# Patient Record
Sex: Male | Born: 1960 | Race: White | Hispanic: No | Smoking: Former smoker
Health system: Southern US, Community
[De-identification: ages and names within clinical notes are randomized; demographics above are authoritative.]

## PROBLEM LIST (undated history)

## (undated) DIAGNOSIS — I2510 Atherosclerotic heart disease of native coronary artery without angina pectoris: Secondary | ICD-10-CM

## (undated) DIAGNOSIS — N200 Calculus of kidney: Secondary | ICD-10-CM

## (undated) DIAGNOSIS — I10 Essential (primary) hypertension: Secondary | ICD-10-CM

---

## 2004-10-01 ENCOUNTER — Emergency Department: Payer: Self-pay | Admitting: Emergency Medicine

## 2008-03-01 ENCOUNTER — Ambulatory Visit: Payer: Self-pay | Admitting: Family Medicine

## 2008-06-07 ENCOUNTER — Ambulatory Visit: Payer: Self-pay | Admitting: Family Medicine

## 2008-10-19 ENCOUNTER — Ambulatory Visit: Payer: Self-pay | Admitting: Internal Medicine

## 2010-02-16 ENCOUNTER — Ambulatory Visit: Payer: Self-pay | Admitting: Internal Medicine

## 2010-02-25 ENCOUNTER — Ambulatory Visit: Payer: Self-pay | Admitting: Family Medicine

## 2010-12-24 ENCOUNTER — Ambulatory Visit: Payer: Self-pay | Admitting: Unknown Physician Specialty

## 2012-03-18 ENCOUNTER — Emergency Department: Payer: Self-pay | Admitting: Emergency Medicine

## 2012-03-26 ENCOUNTER — Ambulatory Visit: Payer: Self-pay | Admitting: Otolaryngology

## 2013-04-09 ENCOUNTER — Ambulatory Visit: Payer: Self-pay

## 2014-06-12 ENCOUNTER — Ambulatory Visit: Payer: Self-pay | Admitting: Urology

## 2014-07-03 ENCOUNTER — Ambulatory Visit: Payer: Self-pay | Admitting: Urology

## 2014-07-06 ENCOUNTER — Emergency Department: Payer: Self-pay | Admitting: Emergency Medicine

## 2014-07-06 LAB — URINALYSIS, COMPLETE
Bacteria: NONE SEEN
Bilirubin,UR: NEGATIVE
Glucose,UR: NEGATIVE mg/dL (ref 0–75)
Ketone: NEGATIVE
Leukocyte Esterase: NEGATIVE
NITRITE: NEGATIVE
PH: 6 (ref 4.5–8.0)
PROTEIN: NEGATIVE
RBC,UR: 9 /HPF (ref 0–5)
SPECIFIC GRAVITY: 1.009 (ref 1.003–1.030)
Squamous Epithelial: <1
WBC UR: 1 /HPF (ref 0–5)

## 2014-07-06 LAB — CBC
HCT: 47.3 % (ref 40.0–52.0)
HGB: 15.7 g/dL (ref 13.0–18.0)
MCH: 28.9 pg (ref 26.0–34.0)
MCHC: 33.2 g/dL (ref 32.0–36.0)
MCV: 87 fL (ref 80–100)
Platelet: 193 10*3/uL (ref 150–440)
RBC: 5.42 10*6/uL (ref 4.40–5.90)
RDW: 13.5 % (ref 11.5–14.5)
WBC: 6.6 10*3/uL (ref 3.8–10.6)

## 2014-07-06 LAB — BASIC METABOLIC PANEL
Anion Gap: 11 (ref 7–16)
BUN: 21 mg/dL — ABNORMAL HIGH (ref 7–18)
CALCIUM: 8.9 mg/dL (ref 8.5–10.1)
Chloride: 105 mmol/L (ref 98–107)
Co2: 22 mmol/L (ref 21–32)
Creatinine: 1.32 mg/dL — ABNORMAL HIGH (ref 0.60–1.30)
Glucose: 112 mg/dL — ABNORMAL HIGH (ref 65–99)
Osmolality: 279 (ref 275–301)
Potassium: 3.9 mmol/L (ref 3.5–5.1)
SODIUM: 138 mmol/L (ref 136–145)

## 2014-07-31 ENCOUNTER — Ambulatory Visit: Payer: Self-pay | Admitting: Urology

## 2019-03-21 ENCOUNTER — Encounter
Admission: EM | Disposition: A | Discharge status: Home / Self Care | Payer: Self-pay | Source: Home / Self Care | Attending: Internal Medicine

## 2019-03-21 ENCOUNTER — Other Ambulatory Visit: Payer: Self-pay

## 2019-03-21 ENCOUNTER — Inpatient Hospital Stay
Admission: EM | Admit: 2019-03-21 | Discharge: 2019-03-23 | DRG: 246 | Disposition: A | Discharge status: Home / Self Care | Payer: BC Managed Care – PPO | Attending: Internal Medicine | Admitting: Internal Medicine

## 2019-03-21 ENCOUNTER — Inpatient Hospital Stay
Admit: 2019-03-21 | Discharge: 2019-03-21 | Disposition: A | Discharge status: Home / Self Care | Payer: BC Managed Care – PPO | Attending: Cardiology | Admitting: Cardiology

## 2019-03-21 ENCOUNTER — Emergency Department: Payer: BC Managed Care – PPO

## 2019-03-21 ENCOUNTER — Encounter: Payer: Self-pay | Admitting: Emergency Medicine

## 2019-03-21 ENCOUNTER — Inpatient Hospital Stay: Admit: 2019-03-21 | Payer: BC Managed Care – PPO

## 2019-03-21 DIAGNOSIS — I469 Cardiac arrest, cause unspecified: Secondary | ICD-10-CM | POA: Diagnosis present

## 2019-03-21 DIAGNOSIS — R079 Chest pain, unspecified: Secondary | ICD-10-CM | POA: Diagnosis present

## 2019-03-21 DIAGNOSIS — Z20828 Contact with and (suspected) exposure to other viral communicable diseases: Secondary | ICD-10-CM | POA: Diagnosis present

## 2019-03-21 DIAGNOSIS — Z87442 Personal history of urinary calculi: Secondary | ICD-10-CM

## 2019-03-21 DIAGNOSIS — R7301 Impaired fasting glucose: Secondary | ICD-10-CM | POA: Diagnosis present

## 2019-03-21 DIAGNOSIS — E785 Hyperlipidemia, unspecified: Secondary | ICD-10-CM | POA: Diagnosis present

## 2019-03-21 DIAGNOSIS — Z6841 Body Mass Index (BMI) 40.0 and over, adult: Secondary | ICD-10-CM

## 2019-03-21 DIAGNOSIS — I4901 Ventricular fibrillation: Secondary | ICD-10-CM | POA: Diagnosis present

## 2019-03-21 DIAGNOSIS — I480 Paroxysmal atrial fibrillation: Secondary | ICD-10-CM | POA: Diagnosis present

## 2019-03-21 DIAGNOSIS — I2102 ST elevation (STEMI) myocardial infarction involving left anterior descending coronary artery: Secondary | ICD-10-CM | POA: Diagnosis present

## 2019-03-21 DIAGNOSIS — I2510 Atherosclerotic heart disease of native coronary artery without angina pectoris: Secondary | ICD-10-CM | POA: Diagnosis present

## 2019-03-21 DIAGNOSIS — E669 Obesity, unspecified: Secondary | ICD-10-CM | POA: Diagnosis present

## 2019-03-21 DIAGNOSIS — Z955 Presence of coronary angioplasty implant and graft: Secondary | ICD-10-CM | POA: Diagnosis not present

## 2019-03-21 DIAGNOSIS — E876 Hypokalemia: Secondary | ICD-10-CM | POA: Diagnosis present

## 2019-03-21 DIAGNOSIS — I213 ST elevation (STEMI) myocardial infarction of unspecified site: Secondary | ICD-10-CM | POA: Diagnosis present

## 2019-03-21 HISTORY — DX: Calculus of kidney: N20.0

## 2019-03-21 HISTORY — PX: LEFT HEART CATH AND CORONARY ANGIOGRAPHY: CATH118249

## 2019-03-21 HISTORY — PX: CORONARY/GRAFT ACUTE MI REVASCULARIZATION: CATH118305

## 2019-03-21 LAB — TROPONIN I (HIGH SENSITIVITY)
Troponin I (High Sensitivity): 17 ng/L (ref <18)
Troponin I (High Sensitivity): 673 ng/L (ref <18)

## 2019-03-21 LAB — BASIC METABOLIC PANEL
Anion gap: 17 — ABNORMAL HIGH (ref 5–15)
BUN: 16 mg/dL (ref 6–20)
CO2: 22 mmol/L (ref 22–32)
Calcium: 9.4 mg/dL (ref 8.9–10.3)
Chloride: 102 mmol/L (ref 98–111)
Creatinine, Ser: 1.1 mg/dL (ref 0.61–1.24)
GFR calc Af Amer: >60 mL/min (ref >60)
GFR calc non Af Amer: >60 mL/min (ref >60)
Glucose, Bld: 188 mg/dL — ABNORMAL HIGH (ref 70–99)
Potassium: 3.4 mmol/L — ABNORMAL LOW (ref 3.5–5.1)
Sodium: 141 mmol/L (ref 135–145)

## 2019-03-21 LAB — PROTIME-INR
INR: 1 (ref 0.8–1.2)
Prothrombin Time: 13.1 seconds (ref 11.4–15.2)

## 2019-03-21 LAB — GLUCOSE, CAPILLARY: Glucose-Capillary: 149 mg/dL — ABNORMAL HIGH (ref 70–99)

## 2019-03-21 LAB — MRSA PCR SCREENING: MRSA by PCR: POSITIVE — AB

## 2019-03-21 LAB — LIPID PANEL
Cholesterol: 206 mg/dL — ABNORMAL HIGH (ref 0–200)
HDL: 37 mg/dL — ABNORMAL LOW (ref >40)
LDL Cholesterol: 145 mg/dL — ABNORMAL HIGH (ref 0–99)
Total CHOL/HDL Ratio: 5.6 RATIO
Triglycerides: 119 mg/dL (ref <150)
VLDL: 24 mg/dL (ref 0–40)

## 2019-03-21 LAB — POCT ACTIVATED CLOTTING TIME
Activated Clotting Time: 235 seconds
Activated Clotting Time: 246 seconds
Activated Clotting Time: 241 seconds

## 2019-03-21 LAB — CBC
HCT: 50.3 % (ref 39.0–52.0)
Hemoglobin: 16.9 g/dL (ref 13.0–17.0)
MCH: 28.9 pg (ref 26.0–34.0)
MCHC: 33.6 g/dL (ref 30.0–36.0)
MCV: 86.1 fL (ref 80.0–100.0)
Platelets: 232 10*3/uL (ref 150–400)
RBC: 5.84 MIL/uL — ABNORMAL HIGH (ref 4.22–5.81)
RDW: 13.5 % (ref 11.5–15.5)
WBC: 10.7 10*3/uL — ABNORMAL HIGH (ref 4.0–10.5)
nRBC: 0 % (ref 0.0–0.2)

## 2019-03-21 LAB — SARS CORONAVIRUS 2 BY RT PCR (HOSPITAL ORDER, PERFORMED IN ~~LOC~~ HOSPITAL LAB): SARS Coronavirus 2: NEGATIVE

## 2019-03-21 LAB — ECHOCARDIOGRAM COMPLETE
Height: 74 in
Weight: 5030.02 oz

## 2019-03-21 LAB — APTT: aPTT: 27 seconds (ref 24–36)

## 2019-03-21 LAB — SARS CORONAVIRUS 2 (TAT 6-24 HRS): SARS Coronavirus 2: NEGATIVE

## 2019-03-21 SURGERY — CORONARY/GRAFT ACUTE MI REVASCULARIZATION
Anesthesia: Moderate Sedation

## 2019-03-21 MED ORDER — LABETALOL HCL 5 MG/ML IV SOLN
10.0000 mg | INTRAVENOUS | Status: AC | PRN
  Administered 2019-03-21: 10 mg via INTRAVENOUS
  Filled 2019-03-21: qty 4

## 2019-03-21 MED ORDER — ENOXAPARIN SODIUM 40 MG/0.4ML ~~LOC~~ SOLN
40.0000 mg | Freq: Two times a day (BID) | SUBCUTANEOUS | Status: DC
  Administered 2019-03-21 – 2019-03-23 (×4): 40 mg via SUBCUTANEOUS
  Filled 2019-03-21 (×4): qty 0.4

## 2019-03-21 MED ORDER — TICAGRELOR 90 MG PO TABS
ORAL_TABLET | ORAL | Status: DC | PRN
  Administered 2019-03-21: 180 mg via ORAL

## 2019-03-21 MED ORDER — SODIUM CHLORIDE 0.9% FLUSH
3.0000 mL | Freq: Two times a day (BID) | INTRAVENOUS | Status: DC
  Administered 2019-03-21 – 2019-03-23 (×4): 3 mL via INTRAVENOUS

## 2019-03-21 MED ORDER — HEPARIN SODIUM (PORCINE) 1000 UNIT/ML IJ SOLN
INTRAMUSCULAR | Status: AC
  Filled 2019-03-21: qty 1

## 2019-03-21 MED ORDER — NITROGLYCERIN 1 MG/10 ML FOR IR/CATH LAB
INTRA_ARTERIAL | Status: AC
  Filled 2019-03-21: qty 10

## 2019-03-21 MED ORDER — HEPARIN (PORCINE) IN NACL 1000-0.9 UT/500ML-% IV SOLN
INTRAVENOUS | Status: DC | PRN
  Administered 2019-03-21: 1000 mL

## 2019-03-21 MED ORDER — POTASSIUM CHLORIDE CRYS ER 20 MEQ PO TBCR
40.0000 meq | EXTENDED_RELEASE_TABLET | Freq: Once | ORAL | Status: AC
  Administered 2019-03-21: 40 meq via ORAL
  Filled 2019-03-21: qty 2

## 2019-03-21 MED ORDER — SODIUM CHLORIDE 0.9% FLUSH: 3.0000 mL | INTRAVENOUS | Status: DC | PRN

## 2019-03-21 MED ORDER — SODIUM CHLORIDE 0.9 % IV SOLN: 250.0000 mL | INTRAVENOUS | Status: DC | PRN

## 2019-03-21 MED ORDER — METOPROLOL TARTRATE 5 MG/5ML IV SOLN
INTRAVENOUS | Status: DC | PRN
  Administered 2019-03-21 (×2): 2.5 mg via INTRAVENOUS

## 2019-03-21 MED ORDER — VERAPAMIL HCL 2.5 MG/ML IV SOLN
INTRAVENOUS | Status: AC
  Filled 2019-03-21: qty 2

## 2019-03-21 MED ORDER — TICAGRELOR 90 MG PO TABS
ORAL_TABLET | ORAL | Status: AC
  Filled 2019-03-21: qty 2

## 2019-03-21 MED ORDER — HEPARIN SODIUM (PORCINE) 5000 UNIT/ML IJ SOLN: 4000.0000 [IU] | Freq: Once | INTRAMUSCULAR | Status: DC

## 2019-03-21 MED ORDER — CHLORHEXIDINE GLUCONATE CLOTH 2 % EX PADS: 6.0000 | MEDICATED_PAD | Freq: Every day | CUTANEOUS | Status: DC

## 2019-03-21 MED ORDER — VERAPAMIL HCL 2.5 MG/ML IV SOLN
INTRAVENOUS | Status: DC | PRN
  Administered 2019-03-21: 2.5 mg via INTRAVENOUS

## 2019-03-21 MED ORDER — HYDRALAZINE HCL 20 MG/ML IJ SOLN: 10.0000 mg | INTRAMUSCULAR | Status: AC | PRN

## 2019-03-21 MED ORDER — MUPIROCIN 2 % EX OINT
1.0000 "application " | TOPICAL_OINTMENT | Freq: Two times a day (BID) | CUTANEOUS | Status: DC
  Administered 2019-03-21 – 2019-03-23 (×5): 1 via NASAL
  Filled 2019-03-21 (×3): qty 22

## 2019-03-21 MED ORDER — SODIUM CHLORIDE 0.9% FLUSH
3.0000 mL | Freq: Once | INTRAVENOUS | Status: AC
  Administered 2019-03-23: 3 mL via INTRAVENOUS

## 2019-03-21 MED ORDER — INFLUENZA VAC SPLIT QUAD 0.5 ML IM SUSY: 0.5000 mL | PREFILLED_SYRINGE | INTRAMUSCULAR | Status: DC

## 2019-03-21 MED ORDER — NITROGLYCERIN 1 MG/10 ML FOR IR/CATH LAB
INTRA_ARTERIAL | Status: DC | PRN
  Administered 2019-03-21: 200 ug via INTRACORONARY

## 2019-03-21 MED ORDER — SODIUM CHLORIDE 0.9 % WEIGHT BASED INFUSION
1.0000 mL/kg/h | INTRAVENOUS | Status: AC
  Administered 2019-03-21 (×2): 1 mL/kg/h via INTRAVENOUS

## 2019-03-21 MED ORDER — TICAGRELOR 90 MG PO TABS
90.0000 mg | ORAL_TABLET | Freq: Two times a day (BID) | ORAL | Status: DC
  Administered 2019-03-21 – 2019-03-23 (×4): 90 mg via ORAL
  Filled 2019-03-21 (×4): qty 1

## 2019-03-21 MED ORDER — ATORVASTATIN CALCIUM 80 MG PO TABS
80.0000 mg | ORAL_TABLET | Freq: Every day | ORAL | Status: DC
  Administered 2019-03-21 – 2019-03-22 (×2): 80 mg via ORAL
  Filled 2019-03-21: qty 1
  Filled 2019-03-21 (×2): qty 4
  Filled 2019-03-21: qty 1

## 2019-03-21 MED ORDER — ACETAMINOPHEN 325 MG PO TABS: 650.0000 mg | ORAL_TABLET | ORAL | Status: DC | PRN

## 2019-03-21 MED ORDER — CHLORHEXIDINE GLUCONATE CLOTH 2 % EX PADS
6.0000 | MEDICATED_PAD | Freq: Every day | CUTANEOUS | Status: DC
  Administered 2019-03-21 – 2019-03-22 (×2): 6 via TOPICAL

## 2019-03-21 MED ORDER — METOPROLOL SUCCINATE ER 50 MG PO TB24
50.0000 mg | ORAL_TABLET | Freq: Every day | ORAL | Status: DC
  Administered 2019-03-21 – 2019-03-23 (×3): 50 mg via ORAL
  Filled 2019-03-21 (×3): qty 1

## 2019-03-21 MED ORDER — NITROGLYCERIN 0.4 MG SL SUBL: 0.4000 mg | SUBLINGUAL_TABLET | SUBLINGUAL | Status: DC | PRN

## 2019-03-21 MED ORDER — ONDANSETRON HCL 4 MG/2ML IJ SOLN: 4.0000 mg | Freq: Four times a day (QID) | INTRAMUSCULAR | Status: DC | PRN

## 2019-03-21 MED ORDER — IOHEXOL 300 MG/ML  SOLN
INTRAMUSCULAR | Status: DC | PRN
  Administered 2019-03-21: 215 mL

## 2019-03-21 MED ORDER — ASPIRIN 81 MG PO CHEW
324.0000 mg | CHEWABLE_TABLET | Freq: Once | ORAL | Status: DC
  Filled 2019-03-21: qty 4

## 2019-03-21 MED ORDER — METOPROLOL TARTRATE 5 MG/5ML IV SOLN
INTRAVENOUS | Status: AC
  Filled 2019-03-21: qty 5

## 2019-03-21 MED ORDER — LISINOPRIL 5 MG PO TABS
2.5000 mg | ORAL_TABLET | Freq: Every day | ORAL | Status: DC
  Administered 2019-03-21 – 2019-03-23 (×3): 2.5 mg via ORAL
  Filled 2019-03-21 (×3): qty 1

## 2019-03-21 MED ORDER — HEPARIN SODIUM (PORCINE) 1000 UNIT/ML IJ SOLN
INTRAMUSCULAR | Status: DC | PRN
  Administered 2019-03-21 (×2): 4000 [IU] via INTRAVENOUS
  Administered 2019-03-21: 8000 [IU] via INTRAVENOUS
  Administered 2019-03-21: 3000 [IU] via INTRAVENOUS

## 2019-03-21 MED ORDER — ASPIRIN 81 MG PO CHEW
81.0000 mg | CHEWABLE_TABLET | Freq: Every day | ORAL | Status: DC
  Administered 2019-03-21 – 2019-03-23 (×3): 81 mg via ORAL
  Filled 2019-03-21 (×3): qty 1

## 2019-03-21 MED ORDER — AMIODARONE HCL IN DEXTROSE 360-4.14 MG/200ML-% IV SOLN
30.0000 mg/h | INTRAVENOUS | Status: DC
  Administered 2019-03-21 – 2019-03-23 (×3): 30 mg/h via INTRAVENOUS
  Filled 2019-03-21 (×5): qty 200

## 2019-03-21 MED ORDER — TRAZODONE HCL 50 MG PO TABS
50.0000 mg | ORAL_TABLET | Freq: Every evening | ORAL | Status: DC | PRN
  Administered 2019-03-21: 22:00:00 50 mg via ORAL
  Filled 2019-03-21: qty 1

## 2019-03-21 MED ORDER — AMIODARONE HCL IN DEXTROSE 360-4.14 MG/200ML-% IV SOLN
60.0000 mg/h | INTRAVENOUS | Status: AC
  Administered 2019-03-21 (×2): 60 mg/h via INTRAVENOUS

## 2019-03-21 SURGICAL SUPPLY — 16 items
BALLN TREK RX 2.5X12 (BALLOONS) ×3
BALLN ~~LOC~~ TREK RX 3.25X12 (BALLOONS) ×3
BALLOON TREK RX 2.5X12 (BALLOONS) ×1 IMPLANT
BALLOON ~~LOC~~ TREK RX 3.25X12 (BALLOONS) ×1 IMPLANT
CATH INFINITI 5FR ANG PIGTAIL (CATHETERS) ×3 IMPLANT
CATH INFINITI JR4 5F (CATHETERS) ×3 IMPLANT
CATH VISTA GUIDE 6FR XB3.5 (CATHETERS) ×3 IMPLANT
DEVICE INFLAT 30 PLUS (MISCELLANEOUS) ×3 IMPLANT
DEVICE RAD COMP TR BAND LRG (VASCULAR PRODUCTS) ×3 IMPLANT
DEVICE RAD TR BAND REGULAR (VASCULAR PRODUCTS) ×3 IMPLANT
GLIDESHEATH SLEND SS 6F .021 (SHEATH) ×3 IMPLANT
KIT MANI 3VAL PERCEP (MISCELLANEOUS) ×3 IMPLANT
PACK CARDIAC CATH (CUSTOM PROCEDURE TRAY) ×3 IMPLANT
STENT RESOLUTE ONYX 3.0X18 (Permanent Stent) ×3 IMPLANT
WIRE ASAHI PROWATER 180CM (WIRE) ×3 IMPLANT
WIRE ROSEN-J .035X260CM (WIRE) ×3 IMPLANT

## 2019-03-21 NOTE — Consult Note (Signed)
[facility] Cardiology  CARDIOLOGY CONSULT NOTE  Patient ID: Atticus Wedin MRN: [id number] DOB/AGE: [birth year] 59 y.o.  Admit date: 03/21/2019 Referring Physician [facility] Primary Physician  Primary Cardiologist Reason for Consultation anterolateral ST elevation myocardial infarction  HPI: 58 year old gentleman referred for evaluation and new onset chest pain and ECG revealing anterolateral ST elevation myocardial infarction.  The patient was in his use of health until 7 AM when he awoke with chest pain with radiation to his right arm.  Patient arrived at [facility] ED where he appeared diaphoretic with nausea with 6 out of 10 chest pain.  ECG revealed atrial fibrillation, with rapid ventricular rate, at 147 bpm, with ST elevations in leads I, aVL,V2 and V3.  In the emergency room the patient experienced ventricular fibrillation requiring defibrillation with brief chest compressions and bag valve mask ventilations.  The patient received 300 mg bolus of IV amiodarone.  Review of systems complete and found to be negative unless listed above     Past Medical History:  Diagnosis Date  . Kidney stone     Past Surgical History:  Procedure Laterality Date  . CORONARY/GRAFT ACUTE MI REVASCULARIZATION N/A 03/21/2019   Procedure: Coronary/Graft Acute MI Revascularization;  Surgeon: Marcina Millard, MD;  Location: [facility] INVASIVE CV LAB;  Service: Cardiovascular;  Laterality: N/A;  . LEFT HEART CATH AND CORONARY ANGIOGRAPHY N/A 03/21/2019   Procedure: LEFT HEART CATH AND CORONARY ANGIOGRAPHY;  Surgeon: Marcina Millard, MD;  Location: [facility] INVASIVE CV LAB;  Service: Cardiovascular;  Laterality: N/A;    No medications prior to admission.   Social History   Socioeconomic History  . Marital status: Divorced    Spouse name: Not on file  . Number of children: Not on file  . Years of education: Not on file  . Highest education level: Not on file  Occupational History  . Not on file  Social Needs   . Financial resource strain: Not on file  . Food insecurity    Worry: Not on file    Inability: Not on file  . Transportation needs    Medical: Not on file    Non-medical: Not on file  Tobacco Use  . Smoking status: Never Smoker  . Smokeless tobacco: Never Used  Substance and Sexual Activity  . Alcohol use: Not Currently  . Drug use: Not on file  . Sexual activity: Not on file  Lifestyle  . Physical activity    Days per week: Not on file    Minutes per session: Not on file  . Stress: Not on file  Relationships  . Social Musician on phone: Not on file    Gets together: Not on file    Attends religious service: Not on file    Active member of club or organization: Not on file    Attends meetings of clubs or organizations: Not on file    Relationship status: Not on file  . Intimate partner violence    Fear of current or ex partner: Not on file    Emotionally abused: Not on file    Physically abused: Not on file    Forced sexual activity: Not on file  Other Topics Concern  . Not on file  Social History Narrative  . Not on file    No family history on file.    Review of systems complete and found to be negative unless listed above      PHYSICAL EXAM  General: Well developed, well nourished, in no  acute distress HEENT:  Normocephalic and atramatic Neck:  No JVD.  Lungs: Clear bilaterally to auscultation and percussion. Heart: HRRR . Normal S1 and S2 without gallops or murmurs.  Abdomen: Bowel sounds are positive, abdomen soft and non-tender  Msk:  Back normal, normal gait. Normal strength and tone for age. Extremities: No clubbing, cyanosis or edema.   Neuro: Alert and oriented X 3. Psych:  Good affect, responds appropriately  Labs:   Lab Results  Component Value Date   WBC 10.7 (H) 03/21/2019   HGB 16.9 03/21/2019   HCT 50.3 03/21/2019   MCV 86.1 03/21/2019   PLT 232 03/21/2019    Recent Labs  Lab 03/21/19 0927  NA 141  K 3.4*  CL 102   CO2 22  BUN 16  CREATININE 1.10  CALCIUM 9.4  GLUCOSE 188*   No results found for: CKTOTAL, CKMB, CKMBINDEX, TROPONINI  Lab Results  Component Value Date   CHOL 206 (H) 03/21/2019   Lab Results  Component Value Date   HDL 37 (L) 03/21/2019   Lab Results  Component Value Date   LDLCALC 145 (H) 03/21/2019   Lab Results  Component Value Date   TRIG 119 03/21/2019   Lab Results  Component Value Date   CHOLHDL 5.6 03/21/2019   No results found for: LDLDIRECT    Radiology: Dg Chest Port 1 View  Result Date: 03/21/2019 CLINICAL DATA:  Chest pain. EXAM: PORTABLE CHEST 1 VIEW COMPARISON:  March 18, 2012 FINDINGS: External pacing pad overlies the left lower hemithorax. Cardiomediastinal silhouette is normal. Mediastinal contours appear intact. 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 There is no evidence of focal airspace consolidation, pleural effusion or pneumothorax. Osseous structures are without acute abnormality. Soft tissues are grossly normal. IMPRESSION: 1. No active cardiopulmonary disease. 2. 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 Electronically Signed   By: Fidela Salisbury M.D.   On: 03/21/2019 09:45    EKG: atrialL fibrillation with rapid ventricular rate, ST elevation leads I, aVL, V2, V3  ASSESSMENT AND PLAN:   1.  Acute anterolateral ST elevation myocardial infarction 2.  Status post V. fib arrest requiring cardioversion and amiodarone bolus, now stable, in sinus rhythm, 3.  Atrial fibrillation with rapid ventricular rate  Recommendations  1.  Amiodarone infusion 2.  Proceed with emergent cardiac catheterization and probable primary PCI  Signed: Isaias Cowman MD,PhD, [facility] 03/21/2019, 11:13 AM

## 2019-03-21 NOTE — ED Notes (Cosign Needed Addendum)
Bill, RN talking to patient when he went unresponsive. Bil called Dr. Jimmye Norman to bedside. Compressions started.

## 2019-03-21 NOTE — ED Provider Notes (Signed)
[facility] Emergency Department Provider Note       Time seen: ----------------------------------------- 9:33 AM on 03/21/2019 -----------------------------------------   I have reviewed the triage vital signs and the nursing notes.  HISTORY   Chief Complaint Chest Pain   HPI Justin Lin is a 58 y.o. male with a history of kidney stones who presents to the ED for chest pain since 7 AM but goes into his right arm and comes in waves of worst pain.  Patient arrived slightly diaphoretic and now has nausea.  Discomfort is 6 out of 10, he has never had this before.  Past Medical History:  Diagnosis Date  . Kidney stone     There are no active problems to display for this patient.   History reviewed. No pertinent surgical history.  Allergies Patient has no known allergies.  Social History Social History   Tobacco Use  . Smoking status: Never Smoker  . Smokeless tobacco: Never Used  Substance Use Topics  . Alcohol use: Not Currently  . Drug use: Not on file   Review of Systems Constitutional: Negative for fever. Cardiovascular: Positive for chest pain Respiratory: Negative for shortness of breath. Gastrointestinal: Negative for abdominal pain, vomiting and diarrhea. Musculoskeletal: Negative for back pain. Skin: Positive for diaphoresis Neurological: Negative for headaches, focal weakness or numbness.  All systems negative/normal/unremarkable except as stated in the HPI  ____________________________________________   PHYSICAL EXAM:  VITAL SIGNS: ED Triage Vitals  Enc Vitals Group     BP 03/21/19 0912 (!) 150/96     Pulse Rate 03/21/19 0912 83     Resp 03/21/19 0912 16     Temp 03/21/19 0912 98.3 F (36.8 C)     Temp Source 03/21/19 0912 Oral     SpO2 03/21/19 0912 98 %     Weight 03/21/19 0906 (!) 320 lb (145.2 kg)     Height 03/21/19 0906 6\' 2"  (1.88 m)     Head Circumference --      Peak Flow --      Pain Score 03/21/19  0906 6     Pain Loc --      Pain Edu? --      Excl. in GC? --    Constitutional: Alert and oriented. Well appearing and in no distress. Eyes: Conjunctivae are normal. Normal extraocular movements. ENT      Head: Normocephalic and atraumatic.      Nose: No congestion/rhinnorhea.      Mouth/Throat: Mucous membranes are moist.      Neck: No stridor. Cardiovascular: Normal rate, regular rhythm. No murmurs, rubs, or gallops. Respiratory: Normal respiratory effort without tachypnea nor retractions. Breath sounds are clear and equal bilaterally. No wheezes/rales/rhonchi. Gastrointestinal: Soft and nontender. Normal bowel sounds Musculoskeletal: Nontender with normal range of motion in extremities. No lower extremity tenderness nor edema. Neurologic:  Normal speech and language. No gross focal neurologic deficits are appreciated.  Skin:  Skin is warm, with slight diaphoresis noted Psychiatric: Mood and affect are normal. Speech and behavior are normal.  ____________________________________________  EKG: Interpreted by me.  Sinus rhythm with rate of 85 bpm, ST depressions are noted inferiorly, normal axis, normal QT  ____________________________________________  ED COURSE:  As part of my medical decision making, I reviewed the following data within the electronic MEDICAL RECORD NUMBER History obtained from family if available, nursing notes, old chart and ekg, as well as notes from prior ED visits. Patient presented for chest pain, we will assess with labs  and imaging as indicated at this time.   Procedures  Justin Lin was evaluated in Emergency Department on 03/21/2019 for the symptoms described in the history of present illness. He was evaluated in the context of the global COVID-19 pandemic, which necessitated consideration that the patient might be at risk for infection with the SARS-CoV-2 virus that causes COVID-19. Institutional protocols and algorithms that pertain to the evaluation of  patients at risk for COVID-19 are in a state of rapid change based on information released by regulatory bodies including the CDC and federal and state organizations. These policies and algorithms were followed during the patient's care in the ED.  ____________________________________________   CRITICAL CARE Performed by: Laurence Aly   Total critical care time: 30 minutes  Critical care time was exclusive of separately billable procedures and treating other patients.  Critical care was necessary to treat or prevent imminent or life-threatening deterioration.  Critical care was time spent personally by me on the following activities: development of treatment plan with patient and/or surrogate as well as nursing, discussions with consultants, evaluation of patient's response to treatment, examination of patient, obtaining history from patient or surrogate, ordering and performing treatments and interventions, ordering and review of laboratory studies, ordering and review of radiographic studies, pulse oximetry and re-evaluation of patient's condition.  LABS (pertinent positives/negatives)  Labs Reviewed  SARS CORONAVIRUS 2 (TAT 6-24 HRS)  BASIC METABOLIC PANEL  CBC  PROTIME-INR  APTT  LIPID PANEL  TROPONIN I (HIGH SENSITIVITY)    RADIOLOGY Images were viewed by me  Chest x-ray  ____________________________________________   DIFFERENTIAL DIAGNOSIS   Unstable angina, MI, PE, dissection  FINAL ASSESSMENT AND PLAN  Chest pain, STEMI   Plan: The patient had presented for chest pain. Patient's labs are still pending at this time.  Patient initially had an EKG which revealed some ST depressions but overall no signs of ST elevation.  He subsequently went into cardiac arrest and appeared to be in V. fib.  He was defibrillated once while chest compressions and bag-valve-mask ventilations were being performed.  He had return of spontaneous circulation.  Patient woke up and is  still complaining of chest pressure.  EKG reveals ST elevation in rapid A. fib.  During this process he was given 300 mg of IV amiodarone and was placed on amiodarone drip.  We also have ordered heparin bolus and given aspirin.  I have activated a code STEMI and he will be taken to the Cath Lab for emergent stenting.   Laurence Aly, MD    Note: This note was generated in part or whole with voice recognition software. Voice recognition is usually quite accurate but there are transcription errors that can and very often do occur. I apologize for any typographical errors that were not detected and corrected.     Earleen Newport, MD 03/21/19 [phone]

## 2019-03-21 NOTE — ED Notes (Signed)
Dr. Jimmye Norman at bedside with other staff members. Vfib on monitor, shock given. Compressions resumed.

## 2019-03-21 NOTE — H&P (Signed)
[place] [facility] [place] NAME: Justin Lin    MR#:  [id number]  DATE OF BIRTH:  Apr 28, 1961  DATE OF ADMISSION:  03/21/2019  PRIMARY CARE PHYSICIAN: No primary care provider on file.   REQUESTING/REFERRING PHYSICIAN: Dr. Saralyn Pilar  CHIEF COMPLAINT:   Chief Complaint  Patient presents with  . Chest Pain    HISTORY OF PRESENT ILLNESS:  Justin Lin  is a 58 y.o. male with a known history of kidney stones who presented to the emergency room with complaints of chest pain.  Chest pain started about 7 AM this morning when patient woke up.  Located on the right side of the chest and radiating to the right arm.  Patient arrived in the emergency room and was reported to have been diaphoretic.  With some nausea.  Twelve-lead EKG revealed atrial fibrillation with rapid ventricular response with rate in the 140s with evidence of ST elevations in lead I, aVL, V2 and V3.  Patient reported to have experienced ventricular fibrillation requiring defibrillation with brief chest compression and bag valve ventilation in the emergency room.  Patient received a bolus of 200 mg of IV amiodarone.  Was immediately taken to the2.  One-vessel coronary artery disease with thrombotic 99% stenosis proximal/mid LAD. Mild use left ventricular function with anterior apical hypokinesis. Successful primary PCI with DES proximal/mid LADubsequently transferred to the ICU.  Medical service called to admit patient.  PAST MEDICAL HISTORY:   Past Medical History:  Diagnosis Date  . Kidney stone     PAST SURGICAL HISTORY:   Past Surgical History:  Procedure Laterality Date  . CORONARY/GRAFT ACUTE MI REVASCULARIZATION N/A 03/21/2019   Procedure: Coronary/Graft Acute MI Revascularization;  Surgeon: Isaias Cowman, MD;  Location: [place] CV LAB;  Service: Cardiovascular;  Laterality: N/A;  . LEFT HEART CATH AND CORONARY ANGIOGRAPHY N/A 03/21/2019   Procedure: LEFT HEART  CATH AND CORONARY ANGIOGRAPHY;  Surgeon: Isaias Cowman, MD;  Location: [place] CV LAB;  Service: Cardiovascular;  Laterality: N/A;    SOCIAL HISTORY:   Social History   Tobacco Use  . Smoking status: Never Smoker  . Smokeless tobacco: Never Used  Substance Use Topics  . Alcohol use: Not Currently    FAMILY HISTORY:  No family history on file.  DRUG ALLERGIES:  No Known Allergies  REVIEW OF SYSTEMS:   Review of Systems  Constitutional: Negative for chills and fever.  HENT: Negative for hearing loss and tinnitus.   Eyes: Negative for blurred vision and double vision.  Respiratory: Negative for cough and shortness of breath.   Cardiovascular: Positive for chest pain. Negative for palpitations.  Gastrointestinal: Negative for abdominal pain, heartburn, nausea and vomiting.  Genitourinary: Negative for dysuria and urgency.  Musculoskeletal: Negative for myalgias and neck pain.  Skin: Negative for itching and rash.  Neurological: Negative for dizziness.  Psychiatric/Behavioral: Negative for depression and hallucinations.    MEDICATIONS AT HOME:   Prior to Admission medications   Not on File      VITAL SIGNS:  Blood pressure 120/86, pulse (!) 120, temperature 98 F (36.7 C), temperature source Axillary, resp. rate 16, height 6\' 2"  (1.88 m), weight (!) 142.6 kg, SpO2 98 %.  PHYSICAL EXAMINATION:  Physical Exam  GENERAL:  58 y.o.-year-old patient lying in the bed with no acute distress.  EYES: Pupils equal, round, reactive to light and accommodation. No scleral icterus. Extraocular muscles intact.  HEENT: Head atraumatic, normocephalic. Oropharynx and nasopharynx clear.  NECK:  Supple, no jugular venous distention. No thyroid enlargement, no tenderness.  LUNGS: Normal breath sounds bilaterally, no wheezing, rales,rhonchi or crepitation. No use of accessory muscles of respiration.  CARDIOVASCULAR: S1, S2 normal. No murmurs, rubs, or gallops.  ABDOMEN: Soft,  nontender, nondistended. Bowel sounds present. No organomegaly or mass.  EXTREMITIES: No pedal edema, cyanosis, or clubbing.  NEUROLOGIC: Cranial nerves II through XII are intact. Muscle strength 5/5 in all extremities. Sensation intact. Gait not checked.  PSYCHIATRIC: The patient is alert and oriented x 3.  SKIN: No obvious rash, lesion, or ulcer.   LABORATORY PANEL:   CBC Recent Labs  Lab 03/21/19 0927  WBC 10.7*  HGB 16.9  HCT 50.3  PLT 232   ------------------------------------------------------------------------------------------------------------------  Chemistries  Recent Labs  Lab 03/21/19 0927  NA 141  K 3.4*  CL 102  CO2 22  GLUCOSE 188*  BUN 16  CREATININE 1.10  CALCIUM 9.4   ------------------------------------------------------------------------------------------------------------------  Cardiac Enzymes No results for input(s): TROPONINI in the last 168 hours. ------------------------------------------------------------------------------------------------------------------  RADIOLOGY:  Dg Chest Port 1 View  Result Date: 03/21/2019 CLINICAL DATA:  Chest pain. EXAM: PORTABLE CHEST 1 VIEW COMPARISON:  March 18, 2012 FINDINGS: External pacing pad overlies the left lower hemithorax. Cardiomediastinal silhouette is normal. Mediastinal contours appear intact. 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 There is no evidence of focal airspace consolidation, pleural effusion or pneumothorax. Osseous structures are without acute abnormality. Soft tissues are grossly normal. IMPRESSION: 1. No active cardiopulmonary disease. 2. 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 Electronically Signed   By: Ted Mcalpine M.D.   On: 03/21/2019 09:45      IMPRESSION AND PLAN:  Patient is a 58 year old male with history of kidney stones admitted with STEMI V. fib requiring defibrillation cardiac catheterization responding with stent  placement.  Admitted to ICU under medical service.  1.  STEMI. Patient presented with chest pain.  Diagnosed with STEMI.  Had a transient episode of ventricular fibrillation requiring shock in the emergency room. Patient subsequently taken for cardiac catheterization which revealed one-vessel coronary artery disease with thrombotic 99% stenosis proximal/mid LAD. Mild use left ventricular function with anterior apical hypokinesis. Successful primary PCI with DES proximal/mid LAD Continue aspirin and Brilinta.  Continue statins.  Lisinopril.  Beta-blocker.  As needed sublingual nitroglycerin. 2D echocardiogram already ordered.  Lipid panel in a.m. Cardiology following.  2.  An episode of ventricular fibrillation requiring cardioversion in the emergency room. Was loaded with amiodarone 300 mg prior to cardiac catheterization today. Further management by cardiology service.  3.  Hypokalemia Replace.  Follow-up on repeat levels in a.m.  DVT prophylaxis; subcu Lovenox to be started later tonight. SCDs this morning.  Patient did get a dose of 4000 units of heparin this morning.  All the records are reviewed and case discussed with ED provider. Management plans discussed with the patient, family and they are in agreement.  CODE STATUS: Full code  TOTAL TIME TAKING CARE OF THIS PATIENT: 63 minutes.    Arijana Narayan M.D on 03/21/2019 at 12:31 PM  Between 7am to 6pm - Pager - [phone]  After 6pm go to [email or web address] - Scientist, research (life sciences) [place] [facility]  Office  [phone]  CC: Primary care physician; No primary care provider on file.   Note: This dictation was prepared with Dragon dictation along with smaller phrase technology. Any transcriptional errors that result from this process are unintentional.

## 2019-03-21 NOTE — ED Notes (Signed)
Cardiologist at bedside.  

## 2019-03-21 NOTE — ED Triage Notes (Signed)
Says chest pain since 7am goes into right arm and it comes in waves of worse pain. Slightly diaphoretic now and has had nausea.

## 2019-03-21 NOTE — ED Notes (Signed)
Labs sent at this time.

## 2019-03-21 NOTE — ED Notes (Signed)
324 mg aspirin given per Dr. Jimmye Norman.

## 2019-03-21 NOTE — Progress Notes (Signed)
*  PRELIMINARY RESULTS* Echocardiogram 2D Echocardiogram has been performed.  Justin Lin 03/21/2019, 1:18 PM

## 2019-03-21 NOTE — ED Notes (Signed)
Bill, Rn put in an IV L AC. Labs collected at this time.

## 2019-03-21 NOTE — ED Notes (Signed)
4000 units of heparin given per Dr. Jimmye Norman.

## 2019-03-21 NOTE — Progress Notes (Signed)
PHARMACIST - PHYSICIAN COMMUNICATION  CONCERNING:  Enoxaparin (Lovenox) for DVT Prophylaxis    RECOMMENDATION: Patient was prescribed enoxaparin 40mg  q24 hours for VTE prophylaxis.   Filed Weights   03/21/19 0906 03/21/19 1150  Weight: (!) 320 lb (145.2 kg) (!) 314 lb 6 oz (142.6 kg)    Body mass index is 40.36 kg/m.  Estimated Creatinine Clearance: 108.8 mL/min (by C-G formula based on SCr of 1.1 mg/dL).   Based on [place] patient is candidate for enoxaparin 40mg  every 12 hour dosing due to BMI being >40.   DESCRIPTION: Pharmacy has adjusted enoxaparin dose per [facility] policy.  Patient is now receiving enoxaparin 40mg  every 12 hours.    Tawnya Crook, PharmD Clinical Pharmacist  03/21/2019 12:49 PM

## 2019-03-21 NOTE — ED Notes (Signed)
Pt alert and oriented, jumping up against staff. Breathing on this own with oxygen at 2 L.

## 2019-03-21 NOTE — ED Notes (Signed)
Pt rolled to cath lab.

## 2019-03-21 NOTE — ED Notes (Signed)
CODE STEMI CALLED. 

## 2019-03-21 NOTE — Progress Notes (Signed)
Cardiovascular and Pulmonary Nurse Navigator Note:   58 year old male who presented to the ED with c/o chest pain.  Patient ruled in for STEMI.  Emergent Cardiac Catheterization performed.   Coronary/Graft Acute MI Revascularization  LEFT HEART CATH AND CORONARY ANGIOGRAPHY  Conclusion    Ost Cx to Prox Cx lesion is 30% stenosed.  Prox LAD to Mid LAD lesion is 99% stenosed.  A drug-eluting stent was successfully placed using a STENT RESOLUTE ONYX 3.0X18.  Post intervention, there is a 0% residual stenosis.   1.  Anterolateral ST elevation myocardial infarction 2.  One-vessel coronary artery disease with thrombotic 99% stenosis proximal/mid LAD 3.  Mild use left ventricular function with anterior apical hypokinesis 4.  Successful primary PCI with DES proximal/mid LAD    EDUCATION:   "Heart Attack Bouncing Back" booklet given and reviewed with patient. Discussed the definition of CAD. Reviewed the location of CAD and where his stent was placed. Informed patient he  will be given a stent card. Explained the purpose of the stent card. Instructed patient to keep stent card in his  wallet.  ? Discussed modifiable risk factors including controlling blood pressure, cholesterol, and blood sugar; following heart healthy diet; maintaining healthy weight; exercise; and smoking cessation, if applicable.   ? Discussed cardiac medications including rationale for taking, mechanisms of action, and side effects. Stressed the importance of taking medications as prescribed.  ? Discussed emergency plan for heart attack symptoms. Patient verbalized understanding of need to call 911 and not to drive himself to ER if having cardiac symptoms / chest pain.   ? Diet of low sodium, low fat, low cholesterol heart healthy diet discussed. Dietitian Consultation for diet education has been ordered and is pending.      Smoking Cessation - Patient is a NEVER smoker.   ? Exercise - Benefits of exercised discussed.  Patient informed this RN that he had been working out with his brother about three times per week x the past 6 weeks.  Informed patient his  cardiologist has referred him to outpatient Cardiac Rehab. An overview of the program was provided. Brochure, informational letter, and CPT billing codes given to patient. No barriers to participating identified.  Patient is interested in participating.  Patient is motivated and stated he has a son with Down Syndrome who needs him to stay healthy.   ?  Patient appreciative of the information.  ? Roanna Epley, RN, BSN, [place]  [facility] Cardiac & Pulmonary Rehab  Cardiovascular & Pulmonary Nurse Navigator  Direct Line: [phone]  Department Phone #: [phone] Fax: [phone]  Email Address: [email or web address]

## 2019-03-21 NOTE — ED Notes (Signed)
Vtach seen on monitor that went into an organized rhythm. Dr. Jimmye Norman felt a pulse at this time. Compressions stopped.

## 2019-03-21 NOTE — ED Notes (Signed)
300 mg of amiodarone ordered by Dr. Jimmye Norman, Given at this time.

## 2019-03-21 NOTE — ED Notes (Signed)
At Dr Saralyn Pilar request hospitalist (Dr Anselm Jungling) notified that pt would be an admission and to please put in admission orders. Dr. Anselm Jungling acknowledged.

## 2019-03-21 NOTE — Progress Notes (Signed)
Ch responded to code STEMI regarding pt. Pt was responsive upon ch arrival and was a/o before being taken to [facility] lab. Ch contacted pt's friend Jacqlyn Larsen at [phone] who drove pt to ER. Ch provided update to friend that stated the pt had c/o having chest/mid-back/arm pain around 0730.  No further needs at this time as pt is unavailable.    03/21/19 0909  Clinical Encounter Type  Visited With Patient;Health care provider;Family  Visit Type ED;Critical Care  Referral From Physician  Consult/Referral To Chaplain  Spiritual Encounters  Spiritual Needs Grief support;Emotional  Stress Factors  Patient Stress Factors Health changes;Major life changes;Loss of control  Family Stress Factors None identified  Advance Directives (For Healthcare)  Does Patient Have a Medical Advance Directive? No

## 2019-03-21 NOTE — ED Notes (Signed)
Staff members at bedside: Marya Amsler, RN charge [employer], RN Chrissy, RN Helene Kelp, RN Sam, RN [place], [place], [employer]

## 2019-03-21 NOTE — Progress Notes (Signed)
Ch f/u with pt after a member of the care team shared that the pt was transferred to ICU unit. Pt was a/o and had a positive affect. Pt shared that he wanted to make sure that the right insurance information was put on his file. Ch contacted patient access/admission so that they could obtain the information on behalf of the pt. Pt was awaiting CV-19 clearance so ch did not go into pt's room.    03/21/19 1500  Clinical Encounter Type  Visited With Patient;Health care provider  Visit Type Follow-up;Critical Care  Spiritual Encounters  Spiritual Needs Emotional;Grief support  Stress Factors  Patient Stress Factors Health changes;Loss of control;Major life changes  Family Stress Factors None identified

## 2019-03-22 LAB — BASIC METABOLIC PANEL
Anion gap: 5 (ref 5–15)
BUN: 14 mg/dL (ref 6–20)
CO2: 25 mmol/L (ref 22–32)
Calcium: 8.7 mg/dL — ABNORMAL LOW (ref 8.9–10.3)
Chloride: 107 mmol/L (ref 98–111)
Creatinine, Ser: 1 mg/dL (ref 0.61–1.24)
GFR calc Af Amer: >60 mL/min (ref >60)
GFR calc non Af Amer: >60 mL/min (ref >60)
Glucose, Bld: 129 mg/dL — ABNORMAL HIGH (ref 70–99)
Potassium: 4 mmol/L (ref 3.5–5.1)
Sodium: 137 mmol/L (ref 135–145)

## 2019-03-22 LAB — CBC
HCT: 47.3 % (ref 39.0–52.0)
Hemoglobin: 15.9 g/dL (ref 13.0–17.0)
MCH: 28.8 pg (ref 26.0–34.0)
MCHC: 33.6 g/dL (ref 30.0–36.0)
MCV: 85.7 fL (ref 80.0–100.0)
Platelets: 222 10*3/uL (ref 150–400)
RBC: 5.52 MIL/uL (ref 4.22–5.81)
RDW: 13.8 % (ref 11.5–15.5)
WBC: 10.8 10*3/uL — ABNORMAL HIGH (ref 4.0–10.5)
nRBC: 0 % (ref 0.0–0.2)

## 2019-03-22 LAB — MAGNESIUM: Magnesium: 1.9 mg/dL (ref 1.7–2.4)

## 2019-03-22 LAB — LIPID PANEL
Cholesterol: 195 mg/dL (ref 0–200)
HDL: 31 mg/dL — ABNORMAL LOW (ref >40)
LDL Cholesterol: 143 mg/dL — ABNORMAL HIGH (ref 0–99)
Total CHOL/HDL Ratio: 6.3 RATIO
Triglycerides: 103 mg/dL (ref <150)
VLDL: 21 mg/dL (ref 0–40)

## 2019-03-22 LAB — HIV ANTIBODY (ROUTINE TESTING W REFLEX): HIV Screen 4th Generation wRfx: NONREACTIVE

## 2019-03-22 MED ORDER — ZOLPIDEM TARTRATE 5 MG PO TABS
5.0000 mg | ORAL_TABLET | Freq: Every evening | ORAL | Status: DC | PRN
  Administered 2019-03-22: 5 mg via ORAL
  Filled 2019-03-22: qty 1

## 2019-03-22 NOTE — Progress Notes (Signed)
[facility] Cardiology  SUBJECTIVE: Patient laying in bed, denies chest pain   Vitals:   03/22/19 0438 03/22/19 0500 03/22/19 0700 03/22/19 0800  BP: 107/76 114/87 102/74 112/80  Pulse: (!) 106 (!) 115 (!) 101 (!) 118  Resp: 19 (!) 21 (!) 21 (!) 22  Temp:    97.8 F (36.6 C)  TempSrc:    Oral  SpO2: 97% 98% 96% 97%  Weight:      Height:         Intake/Output Summary (Last 24 hours) at 03/22/2019 0813 Last data filed at 03/22/2019 9371 Gross per 24 hour  Intake 1779.78 ml  Output 2550 ml  Net -770.22 ml      PHYSICAL EXAM  General: Well developed, well nourished, in no acute distress HEENT:  Normocephalic and atramatic Neck:  No JVD.  Lungs: Clear bilaterally to auscultation and percussion. Heart: HRRR . Normal S1 and S2 without gallops or murmurs.  Abdomen: Bowel sounds are positive, abdomen soft and non-tender  Msk:  Back normal, normal gait. Normal strength and tone for age. Extremities: No clubbing, cyanosis or edema.   Neuro: Alert and oriented X 3. Psych:  Good affect, responds appropriately   LABS: Basic Metabolic Panel: Recent Labs    03/21/19 0927 03/22/19 0305  NA 141 137  K 3.4* 4.0  CL 102 107  CO2 22 25  GLUCOSE 188* 129*  BUN 16 14  CREATININE 1.10 1.00  CALCIUM 9.4 8.7*  MG  --  1.9   Liver Function Tests: No results for input(s): AST, ALT, ALKPHOS, BILITOT, PROT, ALBUMIN in the last 72 hours. No results for input(s): LIPASE, AMYLASE in the last 72 hours. CBC: Recent Labs    03/21/19 0927 03/22/19 0305  WBC 10.7* 10.8*  HGB 16.9 15.9  HCT 50.3 47.3  MCV 86.1 85.7  PLT 232 222   Cardiac Enzymes: No results for input(s): CKTOTAL, CKMB, CKMBINDEX, TROPONINI in the last 72 hours. BNP: Invalid input(s): POCBNP D-Dimer: No results for input(s): DDIMER in the last 72 hours. Hemoglobin A1C: No results for input(s): HGBA1C in the last 72 hours. Fasting Lipid Panel: Recent Labs    03/22/19 0305  CHOL 195  HDL 31*  LDLCALC 143*  TRIG  103  CHOLHDL 6.3   Thyroid Function Tests: No results for input(s): TSH, T4TOTAL, T3FREE, THYROIDAB in the last 72 hours.  Invalid input(s): FREET3 Anemia Panel: No results for input(s): VITAMINB12, FOLATE, FERRITIN, TIBC, IRON, RETICCTPCT in the last 72 hours.  Dg Chest Port 1 View  Result Date: 03/21/2019 CLINICAL DATA:  Chest pain. EXAM: PORTABLE CHEST 1 VIEW COMPARISON:  March 18, 2012 FINDINGS: External pacing pad overlies the left lower hemithorax. Cardiomediastinal silhouette is normal. Mediastinal contours appear intact. 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 There is no evidence of focal airspace consolidation, pleural effusion or pneumothorax. Osseous structures are without acute abnormality. Soft tissues are grossly normal. IMPRESSION: 1. No active cardiopulmonary disease. 2. 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 Electronically Signed   By: Fidela Salisbury M.D.   On: 03/21/2019 09:45     Echo EF 60 to 65%  TELEMETRY: atrial fibrillation 100 bpm:  ASSESSMENT AND PLAN:  Active Problems:   Acute ST elevation myocardial infarction (STEMI) involving left anterior descending (LAD) coronary artery (HCC)   STEMI (ST elevation myocardial infarction) ([place])    1.  Status post anterolateral ST elevation myocardial infarction status post primary PCI with DES proximal LAD 2.  Status post post  V. fib arrest requiring cardioversion and amiodarone bolus 3.  Atrial fibrillation  Recommendations  1.  Agree with current therapy 2.  Continue dual antiplatelet therapy interrupted for 1 year 3.  Continue amiodarone infusion 4.  May transfer to telemetry   Marcina Millard, MD, PhD, [facility] 03/22/2019 8:13 AM

## 2019-03-22 NOTE — Progress Notes (Signed)
Notify Dr. Brett Albino about patient's request to have a sleeping aid, order given for Ambien PRN. RN will continue to monitor

## 2019-03-22 NOTE — Progress Notes (Signed)
Patient ID: Justin Lin, male   DOB: 12/20/1960, 58 y.o.   MRN: [id number]  [employer] PROGRESS NOTE  Justin Lin KYH:[id number] DOB: [birth year] DOA: 03/21/2019 PCP: Patient, No Pcp Per  HPI/Subjective: Patient came in with chest pain radiating to his back shortness of breath and pain going down his right arm.  The chest pain had resolved once they put the stent then but still feeling shortness of breath.  Objective: Vitals:   03/22/19 1200 03/22/19 1300  BP:    Pulse:    Resp: 20 18  Temp:    SpO2:      Intake/Output Summary (Last 24 hours) at 03/22/2019 1317 Last data filed at 03/22/2019 1034 Gross per 24 hour  Intake 1721.28 ml  Output 2550 ml  Net -828.72 ml   Filed Weights   03/21/19 0906 03/21/19 1150  Weight: (!) 145.2 kg (!) 142.6 kg    ROS: Review of Systems  Constitutional: Negative for chills and fever.  Eyes: Negative for blurred vision.  Respiratory: Positive for shortness of breath. Negative for cough.   Cardiovascular: Negative for chest pain.  Gastrointestinal: Negative for abdominal pain, constipation, diarrhea, nausea and vomiting.  Genitourinary: Negative for dysuria.  Musculoskeletal: Negative for joint pain.  Neurological: Negative for dizziness and headaches.   Exam: Physical Exam  Constitutional: He is oriented to person, place, and time.  HENT:  Nose: No mucosal edema.  Mouth/Throat: No oropharyngeal exudate or posterior oropharyngeal edema.  Eyes: Pupils are equal, round, and reactive to light. Conjunctivae, EOM and lids are normal.  Neck: No JVD present. Carotid bruit is not present. No edema present. No thyroid mass and no thyromegaly present.  Cardiovascular: S1 normal, S2 normal and normal heart sounds. An irregularly irregular rhythm present. Exam reveals no gallop.  No murmur heard. Pulses:      Dorsalis pedis pulses are 2+ on the right side and 2+ on the left side.  Respiratory: No respiratory distress. He has no  wheezes. He has no rhonchi. He has no rales.  GI: Soft. Bowel sounds are normal. There is no abdominal tenderness.  Musculoskeletal:     Right ankle: He exhibits no swelling.     Left ankle: He exhibits no swelling.  Lymphadenopathy:    He has no cervical adenopathy.  Neurological: He is alert and oriented to person, place, and time. No cranial nerve deficit.  Skin: Skin is warm. No rash noted. Nails show no clubbing.  Psychiatric: He has a normal mood and affect.      Data Reviewed: Basic Metabolic Panel: Recent Labs  Lab 03/21/19 0927 03/22/19 0305  NA 141 137  K 3.4* 4.0  CL 102 107  CO2 22 25  GLUCOSE 188* 129*  BUN 16 14  CREATININE 1.10 1.00  CALCIUM 9.4 8.7*  MG  --  1.9   CBC: Recent Labs  Lab 03/21/19 0927 03/22/19 0305  WBC 10.7* 10.8*  HGB 16.9 15.9  HCT 50.3 47.3  MCV 86.1 85.7  PLT 232 222    CBG: Recent Labs  Lab 03/21/19 1134  GLUCAP 149*    Recent Results (from the past 240 hour(s))  SARS CORONAVIRUS 2 (TAT 6-24 HRS) Nasopharyngeal Nasopharyngeal Swab     Status: None   Collection Time: 03/21/19  9:03 AM   Specimen: Nasopharyngeal Swab  Result Value Ref Range Status   SARS Coronavirus 2 NEGATIVE NEGATIVE Final    Comment: (NOTE) SARS-CoV-2 target nucleic acids are NOT DETECTED. The SARS-CoV-2 RNA is  generally detectable in upper and lower respiratory specimens during the acute phase of infection. Negative results do not preclude SARS-CoV-2 infection, do not rule out co-infections with other pathogens, and should not be used as the sole basis for treatment or other patient management decisions. Negative results must be combined with clinical observations, patient history, and epidemiological information. The expected result is Negative. Fact Sheet for Patients: SugarRoll.be Fact Sheet for Healthcare Providers: https://www.woods-mathews.com/ This test is not yet approved or cleared by the [place] FDA and  has been authorized for detection and/or diagnosis of SARS-CoV-2 by FDA under an Emergency Use Authorization (EUA). This EUA will remain  in effect (meaning this test can be used) for the duration of the COVID-19 declaration under Section 56 4(b)(1) of the Act, 21 U.S.C. section 360bbb-3(b)(1), unless the authorization is terminated or revoked sooner. Performed at [facility], [place] [address] [place], [place] [address]   SARS Coronavirus 2 by RT PCR (hospital order, performed in [facility] hospital lab) Nasopharyngeal Nasopharyngeal Swab     Status: None   Collection Time: 03/21/19 11:40 AM   Specimen: Nasopharyngeal Swab  Result Value Ref Range Status   SARS Coronavirus 2 NEGATIVE NEGATIVE Final    Comment: (NOTE) If result is NEGATIVE SARS-CoV-2 target nucleic acids are NOT DETECTED. The SARS-CoV-2 RNA is generally detectable in upper and lower  respiratory specimens during the acute phase of infection. The lowest  concentration of SARS-CoV-2 viral copies this assay can detect is 250  copies / mL. A negative result does not preclude SARS-CoV-2 infection  and should not be used as the sole basis for treatment or other  patient management decisions.  A negative result may occur with  improper specimen collection / handling, submission of specimen other  than nasopharyngeal swab, presence of viral mutation(s) within the  areas targeted by this assay, and inadequate number of viral copies  (<250 copies / mL). A negative result must be combined with clinical  observations, patient history, and epidemiological information. If result is POSITIVE SARS-CoV-2 target nucleic acids are DETECTED. The SARS-CoV-2 RNA is generally detectable in upper and lower  respiratory specimens dur ing the acute phase of infection.  Positive  results are indicative of active infection with SARS-CoV-2.  Clinical  correlation with patient history and other diagnostic information is   necessary to determine patient infection status.  Positive results do  not rule out bacterial infection or co-infection with other viruses. If result is PRESUMPTIVE POSTIVE SARS-CoV-2 nucleic acids MAY BE PRESENT.   A presumptive positive result was obtained on the submitted specimen  and confirmed on repeat testing.  While 2019 novel coronavirus  (SARS-CoV-2) nucleic acids may be present in the submitted sample  additional confirmatory testing may be necessary for epidemiological  and / or clinical management purposes  to differentiate between  SARS-CoV-2 and other Sarbecovirus currently known to infect humans.  If clinically indicated additional testing with an alternate test  methodology [phone]) is advised. The SARS-CoV-2 RNA is generally  detectable in upper and lower respiratory sp ecimens during the acute  phase of infection. The expected result is Negative. Fact Sheet for Patients:  StrictlyIdeas.no Fact Sheet for Healthcare Providers: BankingDealers.co.za This test is not yet approved or cleared by the [place] FDA and has been authorized for detection and/or diagnosis of SARS-CoV-2 by FDA under an Emergency Use Authorization (EUA).  This EUA will remain in effect (meaning this test can be used) for the duration of  the COVID-19 declaration under Section 564(b)(1) of the Act, 21 U.S.C. section 360bbb-3(b)(1), unless the authorization is terminated or revoked sooner. Performed at [facility], [address] [place], [place] [address]   MRSA PCR Screening     Status: Abnormal   Collection Time: 03/21/19 12:00 PM  Result Value Ref Range Status   MRSA by PCR POSITIVE (A) NEGATIVE Final    Comment:        The GeneXpert MRSA Assay (FDA approved for NASAL specimens only), is one component of a comprehensive MRSA colonization surveillance program. It is not intended to diagnose MRSA infection nor to guide  or monitor treatment for MRSA infections. RESULT CALLED TO, READ BACK BY AND VERIFIED WITH: CHARLIE WHEATLAND @1318  ON 03/21/2019 BY FMW Performed at [facility], [address] [place], [place] [address]      Studies: [facility]  Result Date: 03/21/2019 CLINICAL DATA:  Chest pain. EXAM: PORTABLE CHEST 1 VIEW COMPARISON:  March 18, 2012 FINDINGS: External pacing pad overlies the left lower hemithorax. Cardiomediastinal silhouette is normal. Mediastinal contours appear intact. 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 There is no evidence of focal airspace consolidation, pleural effusion or pneumothorax. Osseous structures are without acute abnormality. Soft tissues are grossly normal. IMPRESSION: 1. No active cardiopulmonary disease. 2. 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 Electronically Signed   By: Ted Mcalpineobrinka  Dimitrova M.D.   On: 03/21/2019 09:45    Scheduled Meds: . aspirin  324 mg Oral Once  . aspirin  81 mg Oral Daily  . atorvastatin  80 mg Oral q1800  . Chlorhexidine Gluconate Cloth  6 each Topical Daily  . enoxaparin (LOVENOX) injection  40 mg Subcutaneous Q12H  . heparin  4,000 Units Intravenous Once  . influenza vac split quadrivalent PF  0.5 mL Intramuscular Tomorrow-1000  . lisinopril  2.5 mg Oral Daily  . metoprolol succinate  50 mg Oral Daily  . mupirocin ointment  1 application Nasal BID  . sodium chloride flush  3 mL Intravenous Once  . sodium chloride flush  3 mL Intravenous Q12H  . ticagrelor  90 mg Oral BID   Continuous Infusions: . sodium chloride    . amiodarone 30 mg/hr (03/22/19 0400)    Assessment/Plan:  1. STEMI status post urgent procedure cardiac catheterization with stent placed in the mid LAD.  Patient on aspirin, Brilinta, atorvastatin metoprolol and lisinopril. 2. Ventricular fibrillation requiring cardioversion in the emergency room. 3. Paroxysmal atrial fibrillation with rapid  ventricular response on amiodarone drip 4. Hyperlipidemia unspecified.  LDL 143.  On high-dose atorvastatin.  Goal less than 70. 5. Obesity with a BMI of 40.36.  Weight loss needed 6. Impaired fasting glucose check a hemoglobin A1c and place on sliding scale.  Code Status:     Code Status Orders  (From admission, onward)         Start     Ordered   03/21/19 1227  Full code  Continuous     03/21/19 1229        Code Status History    Date Active Date Inactive Code Status Order ID Comments User Context   03/21/2019 1149 03/21/2019 1229 Full Code [id number]  Marcina MillardParaschos, Alexander, MD Inpatient   Advance Care Planning Activity     Family Communication: Spoke with the patient's girlfriend on the phone.  Permission by patient in order to do so Disposition Plan: To be determined  Consultants:  Cardiology  Procedures:  Cardiac  cath  Time spent: 28 minutes  Shirlie Enck [employer]

## 2019-03-22 NOTE — Plan of Care (Signed)
Nutrition Education Note  RD consulted for nutrition education regarding a Heart Healthy diet.   58 y/o male admitted with STEMI now status post urgent procedure cardiac catheterization with stent   Lipid Panel     Component Value Date/Time   CHOL 195 03/22/2019 0305   TRIG 103 03/22/2019 0305   HDL 31 (L) 03/22/2019 0305   CHOLHDL 6.3 03/22/2019 0305   VLDL 21 03/22/2019 0305   LDLCALC 143 (H) 03/22/2019 0305   Spoke with patient today regarding a heart healthy diet. Pt is very motivated to make diet dietary changes and asked a lot of questions. 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 Pt also reports that he has a genetic component to his heart disease because his father had a heart attack at age 61. Pt acknowledges that he needs to loose weight and states that "this is my wake up call".   RD provided "Heart Healthy Nutrition Therapy" handout from the Academy of Nutrition and Dietetics. Reviewed patient's dietary recall. Provided examples on ways to decrease sodium and fat intake in diet. Discouraged intake of processed foods and use of salt shaker. Encouraged fresh fruits and vegetables as well as whole grain sources of carbohydrates to maximize fiber intake. Teach back method used.  Expect good compliance.  Body mass index is 40.36 kg/m. Pt meets criteria for morbid obesity based on current BMI.  Current diet order is HH, patient is consuming approximately 75-100% of meals at this time. Labs and medications reviewed. No further nutrition interventions warranted at this time. RD contact information provided. If additional nutrition issues arise, please re-consult RD.  Koleen Distance MS, RD, LDN Pager #- [phone] Office#- [phone] After Hours Pager: [phone]

## 2019-03-22 NOTE — Progress Notes (Signed)
Notify Dr. Jannifer Franklin about patient's converting to NSR around 2000, patient still on amiodarone drip at 16.6 asked if patient need to be kept for the amiodarone drip over night. Patient on Toprolol XL 50 mg. Patient had vfib arrest yesterday with af rvr, per MD to keep patient on a drip until tomorrow if need to be transition to oral. RN will continue to monitor.

## 2019-03-23 MED ORDER — METOPROLOL SUCCINATE ER 50 MG PO TB24: 50.0000 mg | ORAL_TABLET | Freq: Every day | ORAL | 0 refills | Status: AC

## 2019-03-23 MED ORDER — NITROGLYCERIN 0.4 MG SL SUBL: 0.4000 mg | SUBLINGUAL_TABLET | SUBLINGUAL | 0 refills | Status: AC | PRN

## 2019-03-23 MED ORDER — MUPIROCIN 2 % EX OINT: 1.0000 "application " | TOPICAL_OINTMENT | Freq: Two times a day (BID) | CUTANEOUS | 0 refills | Status: AC

## 2019-03-23 MED ORDER — ASPIRIN EC 81 MG PO TBEC: 81.0000 mg | DELAYED_RELEASE_TABLET | Freq: Every day | ORAL | 2 refills | Status: AC

## 2019-03-23 MED ORDER — AMIODARONE HCL 200 MG PO TABS: 400.0000 mg | ORAL_TABLET | Freq: Two times a day (BID) | ORAL | 0 refills | Status: AC

## 2019-03-23 MED ORDER — ATORVASTATIN CALCIUM 80 MG PO TABS: 80.0000 mg | ORAL_TABLET | Freq: Every day | ORAL | 0 refills | Status: AC

## 2019-03-23 MED ORDER — LISINOPRIL 2.5 MG PO TABS: 2.5000 mg | ORAL_TABLET | Freq: Every day | ORAL | 0 refills | Status: AC

## 2019-03-23 MED ORDER — AMIODARONE HCL 200 MG PO TABS
400.0000 mg | ORAL_TABLET | Freq: Two times a day (BID) | ORAL | Status: DC
  Administered 2019-03-23: 400 mg via ORAL
  Filled 2019-03-23: qty 2

## 2019-03-23 MED ORDER — ACETAMINOPHEN 325 MG PO TABS: 650.0000 mg | ORAL_TABLET | ORAL | Status: AC | PRN

## 2019-03-23 MED ORDER — TICAGRELOR 90 MG PO TABS: 90.0000 mg | ORAL_TABLET | Freq: Two times a day (BID) | ORAL | 0 refills | Status: AC

## 2019-03-23 NOTE — Discharge Instructions (Signed)
Follow-up with primary care physician in 3 days Follow-up with Dr. Lorinda Creed in a week Follow post cardiac cath instructions and no vigorous exercise until seen by cardiology

## 2019-03-23 NOTE — Progress Notes (Signed)
Patient Name: Justin Lin Date of Encounter: 03/23/2019  Hospital Problem List     Active Problems:   Acute ST elevation myocardial infarction (STEMI) involving left anterior descending (LAD) coronary artery (HCC)   STEMI (ST elevation myocardial infarction) [facility])    Patient Profile     Patient status post ST elevation myocardial infarction.  Status post PCI of the proximal LAD with a drug-eluting stent.  Status post V. fib arrest required cardioversion amiodarone bolus.  Transient atrial fibrillation converted to sinus rhythm with IV amiodarone.  Subjective   Feels good this morning with only brief episodes of shortness of breath.  Clinically stable.  Anxious to go home.  Inpatient Medications    . amiodarone  400 mg Oral BID  . aspirin  324 mg Oral Once  . aspirin  81 mg Oral Daily  . atorvastatin  80 mg Oral q1800  . Chlorhexidine Gluconate Cloth  6 each Topical Daily  . enoxaparin (LOVENOX) injection  40 mg Subcutaneous Q12H  . heparin  4,000 Units Intravenous Once  . influenza vac split quadrivalent PF  0.5 mL Intramuscular Tomorrow-1000  . lisinopril  2.5 mg Oral Daily  . metoprolol succinate  50 mg Oral Daily  . mupirocin ointment  1 application Nasal BID  . sodium chloride flush  3 mL Intravenous Q12H  . ticagrelor  90 mg Oral BID    Vital Signs    Vitals:   03/22/19 1951 03/23/19 0140 03/23/19 0335 03/23/19 0756  BP: (!) 122/95 130/86 (!) 119/97 (!) 123/95  Pulse: 75 83 87 82  Resp: 20  20 18   Temp: 97.7 F (36.5 C)  98.6 F (37 C) 97.9 F (36.6 C)  TempSrc: Oral  Oral Oral  SpO2: 98% 100% 97% 98%  Weight:   (!) 141 kg   Height:        Intake/Output Summary (Last 24 hours) at 03/23/2019 1047 Last data filed at 03/23/2019 0900 Gross per 24 hour  Intake 981.54 ml  Output 1250 ml  Net -268.46 ml   Filed Weights   03/21/19 1150 03/22/19 1856 03/23/19 0335  Weight: (!) 142.6 kg (!) 141.9 kg (!) 141 kg    Physical Exam    GEN: Well  nourished, well developed, in no acute distress.  HEENT: normal.  Neck: Supple, no JVD, carotid bruits, or masses. Cardiac: RRR, no murmurs, rubs, or gallops. No clubbing, cyanosis, edema.  Radials/DP/PT 2+ and equal bilaterally.  Respiratory:  Respirations regular and unlabored, clear to auscultation bilaterally. GI: Soft, nontender, nondistended, BS + x 4. MS: no deformity or atrophy. Skin: warm and dry, no rash. Neuro:  Strength and sensation are intact. Psych: Normal affect.  Labs    CBC Recent Labs    03/21/19 0927 03/22/19 0305  WBC 10.7* 10.8*  HGB 16.9 15.9  HCT 50.3 47.3  MCV 86.1 85.7  PLT 232 222   Basic Metabolic Panel Recent Labs    03/24/19 0927 03/22/19 0305  NA 141 137  K 3.4* 4.0  CL 102 107  CO2 22 25  GLUCOSE 188* 129*  BUN 16 14  CREATININE 1.10 1.00  CALCIUM 9.4 8.7*  MG  --  1.9   Liver Function Tests No results for input(s): AST, ALT, ALKPHOS, BILITOT, PROT, ALBUMIN in the last 72 hours. No results for input(s): LIPASE, AMYLASE in the last 72 hours. Cardiac Enzymes No results for input(s): CKTOTAL, CKMB, CKMBINDEX, TROPONINI in the last 72 hours. BNP No results for input(s):  BNP in the last 72 hours. D-Dimer No results for input(s): DDIMER in the last 72 hours. Hemoglobin A1C No results for input(s): HGBA1C in the last 72 hours. Fasting Lipid Panel Recent Labs    03/22/19 0305  CHOL 195  HDL 31*  LDLCALC 143*  TRIG 103  CHOLHDL 6.3   Thyroid Function Tests No results for input(s): TSH, T4TOTAL, T3FREE, THYROIDAB in the last 72 hours.  Invalid input(s): FREET3  Telemetry    Sinus rhythm  ECG    Sinus rhythm nonspecific ST-T wave changes  Radiology    Dg Chest Port 1 View  Result Date: 03/21/2019 CLINICAL DATA:  Chest pain. EXAM: PORTABLE CHEST 1 VIEW COMPARISON:  March 18, 2012 FINDINGS: External pacing pad overlies the left lower hemithorax. Cardiomediastinal silhouette is normal. Mediastinal contours appear  intact. 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 There is no evidence of focal airspace consolidation, pleural effusion or pneumothorax. Osseous structures are without acute abnormality. Soft tissues are grossly normal. IMPRESSION: 1. No active cardiopulmonary disease. 2. 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 Electronically Signed   By: Fidela Salisbury M.D.   On: 03/21/2019 09:45    Assessment & Plan    Anterior myocardial infarction-status post PCI of the proximal LAD with a drug-eluting stent.  Currently doing well hemodynamically stable no chest pain.  Tolerating dual antiplatelet therapy fairly well.  Has brief episodes or shortness of breath which may be secondary to the Brilinta however we will continue with this for now.  We will continue with aspirin and Brilinta.  Will convert amiodarone to p.o. and ambulate and consider discharge if stable.  Will need to continue with atorvastatin at high intensity dose at 80 mg daily.  Lisinopril 2.5 mg daily, Toprol 50 mg daily.  Will need follow-up in 1 week with Dr. Saralyn Pilar.  Avoid any vigorous activity. Signed, Javier Docker Lawson Mahone MD 03/23/2019, 10:47 AM  Pager: [phone]

## 2019-03-23 NOTE — TOC Transition Note (Signed)
Transition of Care [facility]) - CM/SW Discharge Note   Patient Details  Name: Justin Lin MRN: [id number] Date of Birth: 09-Apr-1961  [facility] CM/SW Contact:  Ross Ludwig, LCSW Phone Number: 03/23/2019, 1:30 PM   Clinical Narrative:     Patient is a 58 year old male who is a new Brilenta medication patient.  Patient was given a discount coupon for [place], [place] provided information.  Patient did not express any other needs or concerns about returning back home.    Final next level of care: Home/Self Care Barriers to Discharge: Barriers Resolved   Patient Goals and CMS Choice Patient states their goals for this hospitalization and ongoing recovery are:: To return back home.      Discharge Placement  Patient plans to return back home with home health.                     Discharge Plan and Services                DME Arranged: N/A DME Agency: NA         HH Agency: NA     Representative spoke with at [place]: na  Social Determinants of Health (SDOH) Interventions     Readmission Risk Interventions No flowsheet data found.

## 2019-03-23 NOTE — Discharge Summary (Signed)
[place] [facility] [place] NAME: Justin Lin    MR#:  [id number]  DATE OF BIRTH:  1961-04-22  DATE OF ADMISSION:  03/21/2019 ADMITTING PHYSICIAN: Isaias Cowman, MD  DATE OF DISCHARGE:  03/23/19 PRIMARY CARE PHYSICIAN: Patient, No Pcp Per    ADMISSION DIAGNOSIS:  Chest pain [R07.9] Acute ST elevation myocardial infarction (STEMI) involving left anterior descending (LAD) coronary artery (HCC) [I21.02]  DISCHARGE DIAGNOSIS:  Active Problems:   Acute ST elevation myocardial infarction (STEMI) involving left anterior descending (LAD) coronary artery (HCC)   STEMI (ST elevation myocardial infarction) ([place])   SECONDARY DIAGNOSIS:   Past Medical History:  Diagnosis Date  . Kidney stone     HOSPITAL COURSE:  HPI  Justin Lin  is a 58 y.o. male with a known history of kidney stones who presented to the emergency room with complaints of chest pain.  Chest pain started about 7 AM this morning when patient woke up.  Located on the right side of the chest and radiating to the right arm.  Patient arrived in the emergency room and was reported to have been diaphoretic.  With some nausea.  Twelve-lead EKG revealed atrial fibrillation with rapid ventricular response with rate in the 140s with evidence of ST elevations in lead I, aVL, V2 and V3.  Patient reported to have experienced ventricular fibrillation requiring defibrillation with brief chest compression and bag valve ventilation in the emergency room.  Patient received a bolus of 200 mg of IV amiodarone.  Was immediately taken to the2. One-vessel coronary artery disease with thrombotic 99% stenosis proximal/mid LAD.Mild use left ventricular function with anterior apical hypokinesis.Successful primary PCI with DES proximal/mid LADubsequently transferred to the ICU.  Medical service called to admit patient 1. STEMI status post urgent procedure cardiac catheterization with stent placed in the  mid LAD.  Patient on aspirin 81 mg, Brilinta, atorvastatin metoprolol and lisinopril.  Okay to discharge patient from cardiology standpoint.  Patient is asymptomatic today and ambulated in the hallway around the nurses station 3 times with no symptoms  2. Ventricular fibrillation requiring cardioversion in the emergency room.  Discharging with amiodarone 3. Paroxysmal atrial fibrillation with rapid ventricular response -treated with amiodarone drip.  Well controlled.  Discontinue amiodarone drip and discharge patient with amiodarone 400 mg twice a day for 1 week followed by 200 mg for the next 3 weeks and follow-up with cardiology Dr. Saralyn Pilar in a week.  Patient really on aspirin Brilinta and deferring NOAC to cardiology 4. Hyperlipidemia unspecified.  LDL 143.  On high-dose atorvastatin.  Goal less than 70. 5. Obesity with a BMI of 40.36.  Weight loss needed, lifestyle modifications advised Impaired fasting glucose check a hemoglobin A1c op    DISCHARGE CONDITIONS:   stable  CONSULTS OBTAINED:  Treatment Team:  Pccm, Ander Gaster, MD Isaias Cowman, MD Teodoro Spray, MD   PROCEDURES cardiac cath s/p PCI  DRUG ALLERGIES:  No Known Allergies  DISCHARGE MEDICATIONS:   Allergies as of 03/23/2019   No Known Allergies     Medication List    TAKE these medications   acetaminophen 325 MG tablet Commonly known as: TYLENOL Take 2 tablets (650 mg total) by mouth every 4 (four) hours as needed for headache or mild pain.   amiodarone 200 MG tablet Commonly known as: Pacerone Take 2 tablets (400 mg total) by mouth 2 (two) times daily. Take 400 mg twice a day for 1 week followed by 200 mg twice a  day   aspirin EC 81 MG tablet Take 1 tablet (81 mg total) by mouth daily.   atorvastatin 80 MG tablet Commonly known as: LIPITOR Take 1 tablet (80 mg total) by mouth daily at 6 PM.   lisinopril 2.5 MG tablet Commonly known as: ZESTRIL Take 1 tablet (2.5 mg total) by mouth  daily. Start taking on: March 24, 2019   loratadine 10 MG tablet Commonly known as: CLARITIN Take 10 mg by mouth daily as needed for allergies.   metoprolol succinate 50 MG 24 hr tablet Commonly known as: TOPROL-XL Take 1 tablet (50 mg total) by mouth daily. Take with or immediately following a meal. Start taking on: March 24, 2019   mupirocin ointment 2 % Commonly known as: BACTROBAN Place 1 application into the nose 2 (two) times daily for 5 days.   nitroGLYCERIN 0.4 MG SL tablet Commonly known as: NITROSTAT Place 1 tablet (0.4 mg total) under the tongue every 5 (five) minutes x 3 doses as needed for chest pain.   ticagrelor 90 MG Tabs tablet Commonly known as: BRILINTA Take 1 tablet (90 mg total) by mouth 2 (two) times daily.        DISCHARGE INSTRUCTIONS:  Follow-up with primary care physician in 3 days Follow-up with Dr. Evette Georges in a week Follow post cardiac cath instructions and no vigorous exercise until seen by cardiology   DIET:  Cardiac diet  DISCHARGE CONDITION:  Fair  ACTIVITY:  Activity as tolerated, no vigorous exercise and follow post cardiac cath instructions  OXYGEN:  Home Oxygen: No.   Oxygen Delivery: room air  DISCHARGE LOCATION:  home   If you experience worsening of your admission symptoms, develop shortness of breath, life threatening emergency, suicidal or homicidal thoughts you must seek medical attention immediately by calling 911 or calling your MD immediately  if symptoms less severe.  You Must read complete instructions/literature along with all the possible adverse reactions/side effects for all the Medicines you take and that have been prescribed to you. Take any new Medicines after you have completely understood and accpet all the possible adverse reactions/side effects.   Please note  You were cared for by a hospitalist during your hospital stay. If you have any questions about your discharge medications or the care you  received while you were in the hospital after you are discharged, you can call the unit and asked to speak with the hospitalist on call if the hospitalist that took care of you is not available. Once you are discharged, your primary care physician will handle any further medical issues. Please note that NO REFILLS for any discharge medications will be authorized once you are discharged, as it is imperative that you return to your primary care physician (or establish a relationship with a primary care physician if you do not have one) for your aftercare needs so that they can reassess your need for medications and monitor your lab values.     Today  Chief Complaint  Patient presents with  . Chest Pain   Patient is resting comfortably with no chest pain or shortness of breath and okay to discharge patient from cardiology standpoint.  Ambulated 3 times around the nurses station with no shortness of breath or chest pain  ROS:  CONSTITUTIONAL: Denies fevers, chills. Denies any fatigue, weakness.  EYES: Denies blurry vision, double vision, eye pain. EARS, NOSE, THROAT: Denies tinnitus, ear pain, hearing loss. RESPIRATORY: Denies cough, wheeze, shortness of breath.  CARDIOVASCULAR: Denies chest pain, palpitations,  edema.  GASTROINTESTINAL: Denies nausea, vomiting, diarrhea, abdominal pain. Denies bright red blood per rectum. GENITOURINARY: Denies dysuria, hematuria. ENDOCRINE: Denies nocturia or thyroid problems. HEMATOLOGIC AND LYMPHATIC: Denies easy bruising or bleeding. SKIN: Denies rash or lesion. MUSCULOSKELETAL: Denies pain in neck, back, shoulder, knees, hips or arthritic symptoms.  NEUROLOGIC: Denies paralysis, paresthesias.  PSYCHIATRIC: Denies anxiety or depressive symptoms.   VITAL SIGNS:  Blood pressure (!) 123/95, pulse 82, temperature 97.9 F (36.6 C), temperature source Oral, resp. rate 18, height 6\' 2"  (1.88 m), weight (!) 141 kg, SpO2 96 %.  I/O:    Intake/Output Summary  (Last 24 hours) at 03/23/2019 1229 Last data filed at 03/23/2019 1122 Gross per 24 hour  Intake 981.54 ml  Output 2050 ml  Net -1068.46 ml    PHYSICAL EXAMINATION:  GENERAL:  58 y.o.-year-old patient lying in the bed with no acute distress.  EYES: Pupils equal, round, reactive to light and accommodation. No scleral icterus. Extraocular muscles intact.  HEENT: Head atraumatic, normocephalic. Oropharynx and nasopharynx clear.  NECK:  Supple, no jugular venous distention. No thyroid enlargement, no tenderness.  LUNGS: Normal breath sounds bilaterally, no wheezing, rales,rhonchi or crepitation. No use of accessory muscles of respiration.  CARDIOVASCULAR: S1, S2 normal. No murmurs, rubs, or gallops.  ABDOMEN: Soft, non-tender, non-distended. Bowel sounds present.   EXTREMITIES: Right wrist with pressure dressing no bleeding, no pedal edema, cyanosis, or clubbing.  NEUROLOGIC: Cranial nerves II through XII are intact. Muscle strength 5/5 in all extremities. Sensation intact. Gait not checked.  PSYCHIATRIC: The patient is alert and oriented x 3.  SKIN: No obvious rash, lesion, or ulcer.   DATA REVIEW:   CBC Recent Labs  Lab 03/22/19 0305  WBC 10.8*  HGB 15.9  HCT 47.3  PLT 222    Chemistries  Recent Labs  Lab 03/22/19 0305  NA 137  K 4.0  CL 107  CO2 25  GLUCOSE 129*  BUN 14  CREATININE 1.00  CALCIUM 8.7*  MG 1.9    Cardiac Enzymes No results for input(s): TROPONINI in the last 168 hours.  Microbiology Results  Results for orders placed or performed during the hospital encounter of 03/21/19  SARS CORONAVIRUS 2 (TAT 6-24 HRS) Nasopharyngeal Nasopharyngeal Swab     Status: None   Collection Time: 03/21/19  9:03 AM   Specimen: Nasopharyngeal Swab  Result Value Ref Range Status   SARS Coronavirus 2 NEGATIVE NEGATIVE Final    Comment: (NOTE) SARS-CoV-2 target nucleic acids are NOT DETECTED. The SARS-CoV-2 RNA is generally detectable in upper and lower respiratory  specimens during the acute phase of infection. Negative results do not preclude SARS-CoV-2 infection, do not rule out co-infections with other pathogens, and should not be used as the sole basis for treatment or other patient management decisions. Negative results must be combined with clinical observations, patient history, and epidemiological information. The expected result is Negative. Fact Sheet for Patients: 03/23/19 Fact Sheet for Healthcare Providers: HairSlick.no This test is not yet approved or cleared by the quierodirigir.com FDA and  has been authorized for detection and/or diagnosis of SARS-CoV-2 by FDA under an Emergency Use Authorization (EUA). This EUA will remain  in effect (meaning this test can be used) for the duration of the COVID-19 declaration under Section 56 4(b)(1) of the Act, 21 U.S.C. section 360bbb-3(b)(1), unless the authorization is terminated or revoked sooner. Performed at [facility], [address] [place], [place]   SARS Coronavirus 2 by RT PCR (hospital  order, performed in [facility] hospital lab) Nasopharyngeal Nasopharyngeal Swab     Status: None   Collection Time: 03/21/19 11:40 AM   Specimen: Nasopharyngeal Swab  Result Value Ref Range Status   SARS Coronavirus 2 NEGATIVE NEGATIVE Final    Comment: (NOTE) If result is NEGATIVE SARS-CoV-2 target nucleic acids are NOT DETECTED. The SARS-CoV-2 RNA is generally detectable in upper and lower  respiratory specimens during the acute phase of infection. The lowest  concentration of SARS-CoV-2 viral copies this assay can detect is 250  copies / mL. A negative result does not preclude SARS-CoV-2 infection  and should not be used as the sole basis for treatment or other  patient management decisions.  A negative result may occur with  improper specimen collection / handling, submission of specimen other  than nasopharyngeal  swab, presence of viral mutation(s) within the  areas targeted by this assay, and inadequate number of viral copies  (<250 copies / mL). A negative result must be combined with clinical  observations, patient history, and epidemiological information. If result is POSITIVE SARS-CoV-2 target nucleic acids are DETECTED. The SARS-CoV-2 RNA is generally detectable in upper and lower  respiratory specimens dur ing the acute phase of infection.  Positive  results are indicative of active infection with SARS-CoV-2.  Clinical  correlation with patient history and other diagnostic information is  necessary to determine patient infection status.  Positive results do  not rule out bacterial infection or co-infection with other viruses. If result is PRESUMPTIVE POSTIVE SARS-CoV-2 nucleic acids MAY BE PRESENT.   A presumptive positive result was obtained on the submitted specimen  and confirmed on repeat testing.  While 2019 novel coronavirus  (SARS-CoV-2) nucleic acids may be present in the submitted sample  additional confirmatory testing may be necessary for epidemiological  and / or clinical management purposes  to differentiate between  SARS-CoV-2 and other Sarbecovirus currently known to infect humans.  If clinically indicated additional testing with an alternate test  methodology [phone]) is advised. The SARS-CoV-2 RNA is generally  detectable in upper and lower respiratory sp ecimens during the acute  phase of infection. The expected result is Negative. Fact Sheet for Patients:  BoilerBrush.com.cy Fact Sheet for Healthcare Providers: https://pope.com/ This test is not yet approved or cleared by the [place] FDA and has been authorized for detection and/or diagnosis of SARS-CoV-2 by FDA under an Emergency Use Authorization (EUA).  This EUA will remain in effect (meaning this test can be used) for the duration of the COVID-19 declaration  under Section 564(b)(1) of the Act, 21 U.S.C. section 360bbb-3(b)(1), unless the authorization is terminated or revoked sooner. Performed at [facility], [address] [place], [place] [address]   MRSA PCR Screening     Status: Abnormal   Collection Time: 03/21/19 12:00 PM  Result Value Ref Range Status   MRSA by PCR POSITIVE (A) NEGATIVE Final    Comment:        The GeneXpert MRSA Assay (FDA approved for NASAL specimens only), is one component of a comprehensive MRSA colonization surveillance program. It is not intended to diagnose MRSA infection nor to guide or monitor treatment for MRSA infections. RESULT CALLED TO, READ BACK BY AND VERIFIED WITH: CHARLIE WHEATLAND  ON 03/21/2019 BY FMW Performed at [facility], [address] [place] [place], [place] [address]     RADIOLOGY:  Dg Chest Port 1 View  Result Date: 03/21/2019 CLINICAL DATA:  Chest pain. EXAM: PORTABLE CHEST 1 VIEW  COMPARISON:  March 18, 2012 FINDINGS: External pacing pad overlies the left lower hemithorax. Cardiomediastinal silhouette is normal. Mediastinal contours appear intact. 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 There is no evidence of focal airspace consolidation, pleural effusion or pneumothorax. Osseous structures are without acute abnormality. Soft tissues are grossly normal. IMPRESSION: 1. No active cardiopulmonary disease. 2. 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 Electronically Signed   By: Ted Mcalpineobrinka  Dimitrova M.D.   On: 03/21/2019 09:45    EKG:   Orders placed or performed during the hospital encounter of 03/21/19  . ED EKG  . EKG 12-Lead  . EKG 12-Lead  . ED EKG  . EKG 12-Lead  . EKG 12-Lead  . EKG 12-Lead  . EKG 12-Lead  . EKG 12-Lead immediately post procedure  . EKG 12-Lead  . EKG 12-Lead immediately post procedure  . EKG 12-lead  . EKG 12-Lead      Management plans discussed with the patient, he is in  agreement.  CODE STATUS:     Code Status Orders  (From admission, onward)         Start     Ordered   03/21/19 1227  Full code  Continuous     03/21/19 1229        Code Status History    Date Active Date Inactive Code Status Order ID Comments User Context   03/21/2019 1149 03/21/2019 1229 Full Code [id number]  Marcina MillardParaschos, Alexander, MD Inpatient   Advance Care Planning Activity      TOTAL TIME TAKING CARE OF THIS PATIENT:  45  minutes.   Note: This dictation was prepared with Dragon dictation along with smaller phrase technology. Any transcriptional errors that result from this process are unintentional.   @[facility] @  on 03/23/2019 at 12:29 PM  Between 7am to 6pm - Pager - [phone]  After 6pm go to [email or web address] - password [email or web address]  [facility]  Office  [phone]  CC: Primary care physician; Patient, No Pcp Per

## 2019-03-23 NOTE — Progress Notes (Signed)
Discharge instructions explained to pt/ verbalized an understanding/ iv and tele removed/ RX given to pt/ will transport off unit when ride arrives.   

## 2019-03-26 ENCOUNTER — Encounter: Payer: BC Managed Care – PPO | Attending: Cardiology | Admitting: *Deleted

## 2019-03-26 DIAGNOSIS — Z955 Presence of coronary angioplasty implant and graft: Secondary | ICD-10-CM

## 2019-03-26 DIAGNOSIS — I213 ST elevation (STEMI) myocardial infarction of unspecified site: Secondary | ICD-10-CM | POA: Insufficient documentation

## 2019-03-26 DIAGNOSIS — I2101 ST elevation (STEMI) myocardial infarction involving left main coronary artery: Secondary | ICD-10-CM

## 2019-03-26 NOTE — Progress Notes (Signed)
Completed virtual orientation.  

## 2019-03-28 ENCOUNTER — Other Ambulatory Visit: Payer: Self-pay

## 2019-03-28 DIAGNOSIS — Z955 Presence of coronary angioplasty implant and graft: Secondary | ICD-10-CM

## 2019-03-28 DIAGNOSIS — I213 ST elevation (STEMI) myocardial infarction of unspecified site: Secondary | ICD-10-CM | POA: Diagnosis present

## 2019-03-28 DIAGNOSIS — I2101 ST elevation (STEMI) myocardial infarction involving left main coronary artery: Secondary | ICD-10-CM

## 2019-03-28 NOTE — Progress Notes (Signed)
Cardiac Individual Treatment Plan  Patient Details  Name: Justin Lin MRN: [id number] Date of Birth: [birth year] Referring Provider:     Cardiac Rehab from 03/28/2019 in 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  acetaminophen (TYLENOL) 325 MG tablet, Take 2 tablets (650 mg total) by mouth every 4 (four) hours as needed for headache or mild pain., Disp: , Rfl:  .  amiodarone (PACERONE) 200 MG tablet, Take 2 tablets (400 mg total) by mouth 2 (two) times daily. Take 400 mg twice a day for 1 week followed by 200 mg twice a day, Disp: 90 tablet, Rfl: 0 .  aspirin EC 81 MG tablet, Take 1 tablet (81 mg total) by mouth daily., Disp: 150 tablet, Rfl: 2 .  atorvastatin (LIPITOR) 80 MG tablet, Take 1 tablet (80 mg total) by mouth daily at 6 PM., Disp: 30 tablet, Rfl: 0 .  lisinopril (ZESTRIL) 2.5 MG tablet, Take 1 tablet (2.5 mg total) by mouth daily., Disp: 30 tablet, Rfl: 0 .  loratadine (CLARITIN) 10 MG tablet, Take 10 mg by mouth daily as needed for allergies., Disp: , Rfl:  .  metoprolol succinate (TOPROL-XL) 50 MG 24 hr tablet, Take 1 tablet (50 mg total) by mouth daily. Take with or immediately following a meal., Disp: 30 tablet, Rfl: 0 .  mupirocin ointment (BACTROBAN) 2 %, Place 1 application into the nose 2 (two) times daily for 5 days. (Patient not taking: Reported on 03/26/2019), Disp: 22 g, Rfl: 0 .  nitroGLYCERIN (NITROSTAT) 0.4 MG SL tablet, Place 1 tablet (0.4 mg total) under the tongue every 5 (five) minutes x 3 doses as needed for chest pain., Disp: 10 tablet, Rfl: 0 .  ticagrelor (BRILINTA) 90 MG TABS tablet, Take 1  tablet (90 mg total) by mouth 2 (two) times daily., Disp: 60 tablet, Rfl: 0  Past Medical History: Past Medical History:  Diagnosis Date  . Kidney stone     Tobacco Use: Social History   Tobacco Use  Smoking Status Former Smoker  . Packs/day: 0.25  . Years: 1.00  . Pack years: 0.25  . Types: Cigarettes  Smokeless Tobacco Never Used  Tobacco Comment   [age] year old, hasn't smoked in 40 years    Labs: Recent Review Flowsheet Data    Labs for ITP Cardiac and Pulmonary Rehab Latest Ref Rng & Units 03/21/2019 03/22/2019   Cholestrol 0 - 200 mg/dL 206(H) 195   LDLCALC 0 - 99 mg/dL 145(H) 143(H)   HDL >40 mg/dL 37(L) 31(L)   Trlycerides <150 mg/dL 119 103       Exercise Target Goals: Exercise Program Goal: Individual exercise prescription set using results from initial 6 min walk test and THRR while considering  patient's activity barriers and safety.   Exercise Prescription Goal: Initial exercise prescription builds to 30-45 minutes a day of aerobic activity, 2-3 days per week.  Home exercise guidelines will be given to patient during program as part of exercise prescription that the participant will acknowledge.  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 HR  112 bpm     Max Ex. BP  120/90     2 Minute Post BP  118/82        Oxygen Initial Assessment:   Oxygen Re-Evaluation:   Oxygen Discharge  (Final Oxygen Re-Evaluation):   Initial Exercise Prescription: Initial Exercise Prescription - 03/28/19 1500      Date of Initial Exercise RX and Referring Provider   Date  03/28/19    Referring Provider  Paraschos      Treadmill   MPH  2.5    Grade  1    Minutes  15    METs  2.7      Recumbant Bike   Level  2    RPM  80    Watts  40    Minutes  15    METs  3      NuStep   Level  3    SPM  80    Minutes  15    METs  3      Elliptical   Level  1    Speed  3.5    Minutes  15    METs  3      REL-XR   Level  2    Watts  40    Speed  50    Minutes  15    METs  3      Prescription Details   Duration  Progress to 30 minutes of continuous aerobic without signs/symptoms of physical distress      Intensity   THRR 40-80% of Max Heartrate  113-145    Ratings of Perceived Exertion  11-15    Perceived Dyspnea  0-4      Progression   Progression  Continue progressive overload as per policy without signs/symptoms or physical distress.      Resistance Training   Training Prescription  Yes    Weight  3    Reps  10-15       Perform Capillary Blood Glucose checks as needed.  Exercise Prescription Changes:   Exercise Comments:   Exercise Goals and Review: Exercise Goals    Row Name 03/28/19 1411             Exercise Goals   Increase Physical Activity  Yes       Intervention  Provide advice, education, support and counseling about physical activity/exercise needs.;Develop an individualized exercise prescription for aerobic and resistive training based on initial evaluation findings, risk stratification, comorbidities and participant's personal goals.       Expected Outcomes  Short Term: Attend rehab on a regular basis to increase amount of  physical activity.;Long Term: Add in home exercise to make exercise part of routine and to increase amount of physical activity.;Long Term: Exercising regularly at least 3-5 days a week.       Increase Strength and Stamina   Yes       Intervention  Provide advice, education, support and counseling about physical activity/exercise needs.;Develop an individualized exercise prescription for aerobic and resistive training based on initial evaluation findings, risk stratification, comorbidities and participant's personal goals.       Expected Outcomes  Short Term: Increase workloads from initial exercise prescription for resistance, speed, and METs.;Short Term: Perform resistance training exercises routinely during rehab and add in resistance training at home;Long Term: Improve cardiorespiratory fitness, muscular endurance and strength as measured by increased METs and functional capacity (6MWT)       Able to understand and use rate of perceived exertion (RPE) scale  Yes       Intervention  Provide education and explanation on how to use RPE scale       Expected Outcomes  Short Term: Able to use RPE daily in rehab to express subjective intensity level;Long Term:  Able to use RPE to guide intensity level when exercising independently       Able to understand and use Dyspnea scale  Yes       Intervention  Provide education and explanation on how to use Dyspnea scale       Expected Outcomes  Short Term: Able to use Dyspnea scale daily in rehab to express subjective sense of shortness of breath during exertion;Long Term: Able to use Dyspnea scale to guide intensity level when exercising independently       Knowledge and understanding of Target Heart Rate Range (THRR)  Yes       Intervention  Provide education and explanation of THRR including how the numbers were predicted and where they are located for reference       Expected Outcomes  Short Term: Able to state/look up THRR;Short Term: Able to use daily as guideline for intensity in rehab;Long Term: Able to use THRR to govern intensity when exercising independently       Able to check pulse independently  Yes       Intervention  Provide education and demonstration on how to check  pulse in carotid and radial arteries.;Review the importance of being able to check your own pulse for safety during independent exercise       Expected Outcomes  Short Term: Able to explain why pulse checking is important during independent exercise;Long Term: Able to check pulse independently and accurately       Understanding of Exercise Prescription  Yes       Intervention  Provide education, explanation, and written materials on patient's individual exercise prescription       Expected Outcomes  Short Term: Able to explain program exercise prescription;Long Term: Able to explain home exercise prescription to exercise independently          Exercise Goals Re-Evaluation :   Discharge Exercise Prescription (Final Exercise Prescription Changes):   Nutrition:  Target Goals: Understanding of nutrition guidelines, daily intake of sodium <1571m, cholesterol <2013m calories 30% from fat and 7% or less from saturated fats, daily to have 5 or more servings of fruits and vegetables.  Biometrics:    Nutrition Therapy Plan and Nutrition Goals:   Nutrition Assessments:   Nutrition Goals Re-Evaluation:   Nutrition Goals Discharge (Final Nutrition Goals Re-Evaluation):   Psychosocial: Target Goals: Acknowledge  presence or absence of significant depression and/or stress, maximize coping skills, provide positive support system. Participant is able to verbalize types and ability to use techniques and skills needed for reducing stress and depression.   Initial Review & Psychosocial Screening: Initial Psych Review & Screening - 03/26/19 1012      Initial Review   Current issues with  Current Stress Concerns;Current Anxiety/Panic    Source of Stress Concerns  Family    Comments  Has a son 32 yo with down syndrome that lives with him.  He is a single parent and manages his care.  His son's mom does not support him in any way and that was why he retired early.      Family Dynamics   Good  Support System?  Yes   Jacqlyn Larsen, girlfriend for 30 years, Lisabeth Devoid, his son's grandmother, and brother and sister that all live nearby   Comments  He has been a little more emotional with everything since getting home.      Barriers   Psychosocial barriers to participate in program  The patient should benefit from training in stress management and relaxation.;Psychosocial barriers identified (see note)      Screening Interventions   Interventions  To provide support and resources with identified psychosocial needs;Provide feedback about the scores to participant    Expected Outcomes  Short Term goal: Utilizing psychosocial counselor, staff and physician to assist with identification of specific Stressors or current issues interfering with healing process. Setting desired goal for each stressor or current issue identified.;Long Term Goal: Stressors or current issues are controlled or eliminated.;Short Term goal: Identification and review with participant of any Quality of Life or Depression concerns found by scoring the questionnaire.;Long Term goal: The participant improves quality of Life and PHQ9 Scores as seen by post scores and/or verbalization of changes       Quality of Life Scores:  Quality of Life - 03/28/19 1412      Quality of Life   Select  Quality of Life      Quality of Life Scores   Health/Function Pre  20.1 %    Socioeconomic Pre  24 %    Psych/Spiritual Pre  24 %    Family Pre  27.6 %    GLOBAL Pre  22.86 %      Scores of 19 and below usually indicate a poorer quality of life in these areas.  A difference of  2-3 points is a clinically meaningful difference.  A difference of 2-3 points in the total score of the Quality of Life Index has been associated with significant improvement in overall quality of life, self-image, physical symptoms, and general health in studies assessing change in quality of life.  PHQ-9: Recent Review Flowsheet Data    Depression screen [facility] 2/9  03/28/2019   Decreased Interest 0   Down, Depressed, Hopeless 0   PHQ - 2 Score 0   Altered sleeping 3   Tired, decreased energy 1   Change in appetite 2   Feeling bad or failure about yourself  1   Trouble concentrating 0   Moving slowly or fidgety/restless 1   Suicidal thoughts 0   PHQ-9 Score 8   Difficult doing work/chores Somewhat difficult     Interpretation of Total Score  Total Score Depression Severity:  1-4 = Minimal depression, 5-9 = Mild depression, 10-14 = Moderate depression, 15-19 = Moderately severe depression, 20-27 = Severe depression   Psychosocial Evaluation and Intervention: Psychosocial Evaluation -  03/26/19 1042      Psychosocial Evaluation & Interventions   Interventions  Encouraged to exercise with the program and follow exercise prescription;Stress management education    Comments  Mr. Neidert has been more emotional than his normal since getting home from his MI.  He has mentioned that this is not his normal.  We talked about how it is normal to feel this way especially after a heart event and coming home from hospital.  He has a son with down syndrome that lives with him.  He is the primary caregiver for his son as his son's mother no longer offers support finanacially or physically.  He retired early to better manage his son's care at home.  He also has a long time girlfriend that he says they have been together so long that they are practically married.  He has other family that lives near by in the county that offer some help.  His son's maternal grandmother helps him from time to time as well.  He noted that he does have some anxiety brought on by caring for his son, but currently is managing and not taking anything. Offered that is he feels that he may need something we can help facilitate that for him.    Expected Outcomes  Short: Asess QOL and PHQ9 on Thursday.  Long: Continue to follow up with pschosocial issues and get him to exercise for mental boost and  self care.    Continue Psychosocial Services   Follow up required by staff       Psychosocial Re-Evaluation:   Psychosocial Discharge (Final Psychosocial Re-Evaluation):   Vocational Rehabilitation: Provide vocational rehab assistance to qualifying candidates.   Vocational Rehab Evaluation & Intervention: Vocational Rehab - 03/26/19 1011      Initial Vocational Rehab Evaluation & Intervention   Assessment shows need for Vocational Rehabilitation  No   currently retired      Education: Education Goals: Education classes will be provided on a variety of topics geared toward better understanding of heart health and risk factor modification. Participant will state understanding/return demonstration of topics presented as noted by education test scores.  Learning Barriers/Preferences: Learning Barriers/Preferences - 03/26/19 1012      Learning Barriers/Preferences   Learning Barriers  Sight   glasses   Learning Preferences  None       Education Topics:  AED/CPR: - Group verbal and written instruction with the use of models to demonstrate the basic use of the AED with the basic ABC's of resuscitation.   General Nutrition Guidelines/Fats and Fiber: -Group instruction provided by verbal, written material, models and posters to present the general guidelines for heart healthy nutrition. Gives an explanation and review of dietary fats and fiber.   Controlling Sodium/Reading Food Labels: -Group verbal and written material supporting the discussion of sodium use in heart healthy nutrition. Review and explanation with models, verbal and written materials for utilization of the food label.   Exercise Physiology & General Exercise Guidelines: - Group verbal and written instruction with models to review the exercise physiology of the cardiovascular system and associated critical values. Provides general exercise guidelines with specific guidelines to those with heart or lung  disease.    Aerobic Exercise & Resistance Training: - Gives group verbal and written instruction on the various components of exercise. Focuses on aerobic and resistive training programs and the benefits of this training and how to safely progress through these programs..   Flexibility, Balance, Mind/Body Relaxation: Provides group verbal/written  instruction on the benefits of flexibility and balance training, including mind/body exercise modes such as yoga, pilates and tai chi.  Demonstration and skill practice provided.   Stress and Anxiety: - Provides group verbal and written instruction about the health risks of elevated stress and causes of high stress.  Discuss the correlation between heart/lung disease and anxiety and treatment options. Review healthy ways to manage with stress and anxiety.   Depression: - Provides group verbal and written instruction on the correlation between heart/lung disease and depressed mood, treatment options, and the stigmas associated with seeking treatment.   Anatomy & Physiology of the Heart: - Group verbal and written instruction and models provide basic cardiac anatomy and physiology, with the coronary electrical and arterial systems. Review of Valvular disease and Heart Failure   Cardiac Procedures: - Group verbal and written instruction to review commonly prescribed medications for heart disease. Reviews the medication, class of the drug, and side effects. Includes the steps to properly store meds and maintain the prescription regimen. (beta blockers and nitrates)   Cardiac Medications I: - Group verbal and written instruction to review commonly prescribed medications for heart disease. Reviews the medication, class of the drug, and side effects. Includes the steps to properly store meds and maintain the prescription regimen.   Cardiac Medications II: -Group verbal and written instruction to review commonly prescribed medications for heart  disease. Reviews the medication, class of the drug, and side effects. (all other drug classes)    Go Sex-Intimacy & Heart Disease, Get SMART - Goal Setting: - Group verbal and written instruction through game format to discuss heart disease and the return to sexual intimacy. Provides group verbal and written material to discuss and apply goal setting through the application of the S.M.A.R.T. Method.   Other Matters of the Heart: - Provides group verbal, written materials and models to describe Stable Angina and Peripheral Artery. Includes description of the disease process and treatment options available to the cardiac patient.   Exercise & Equipment Safety: - Individual verbal instruction and demonstration of equipment use and safety with use of the equipment.   Cardiac Rehab from 03/28/2019 in [facility] Cardiac and Pulmonary Rehab  Date  03/28/19  Educator  [place]  Instruction Review Code  1- Verbalizes Understanding      Infection Prevention: - Provides verbal and written material to individual with discussion of infection control including proper hand washing and proper equipment cleaning during exercise session.   Cardiac Rehab from 03/28/2019 in [facility] Cardiac and Pulmonary Rehab  Date  03/28/19  Educator  [place]  Instruction Review Code  1- Verbalizes Understanding      Falls Prevention: - Provides verbal and written material to individual with discussion of falls prevention and safety.   Cardiac Rehab from 03/28/2019 in [facility] Cardiac and Pulmonary Rehab  Date  03/28/19  Educator  [place]  Instruction Review Code  1- Verbalizes Understanding      Diabetes: - Individual verbal and written instruction to review signs/symptoms of diabetes, desired ranges of glucose level fasting, after meals and with exercise. Acknowledge that pre and post exercise glucose checks will be done for 3 sessions at entry of program.   Know Your Numbers and Risk Factors: -Group verbal and written instruction  about important numbers in your health.  Discussion of what are risk factors and how they play a role in the disease process.  Review of Cholesterol, Blood Pressure, Diabetes, and BMI and the role they play in your overall health.  Sleep Hygiene: -Provides group verbal and written instruction about how sleep can affect your health.  Define sleep hygiene, discuss sleep cycles and impact of sleep habits. Review good sleep hygiene tips.    Other: -Provides group and verbal instruction on various topics (see comments)   Knowledge Questionnaire Score: Knowledge Questionnaire Score - 03/28/19 1413      Knowledge Questionnaire Score   Pre Score  24/26 exercise and nutrition       Core Components/Risk Factors/Patient Goals at Admission: Personal Goals and Risk Factors at Admission - 03/26/19 1048      Core Components/Risk Factors/Patient Goals on Admission    Weight Management  Yes;Obesity;Weight Loss    Intervention  Weight Management: Develop a combined nutrition and exercise program designed to reach desired caloric intake, while maintaining appropriate intake of nutrient and fiber, sodium and fats, and appropriate energy expenditure required for the weight goal.;Weight Management: Provide education and appropriate resources to help participant work on and attain dietary goals.;Weight Management/Obesity: Establish reasonable short term and long term weight goals.;Obesity: Provide education and appropriate resources to help participant work on and attain dietary goals.    Expected Outcomes  Short Term: Continue to assess and modify interventions until short term weight is achieved;Long Term: Adherence to nutrition and physical activity/exercise program aimed toward attainment of established weight goal;Weight Loss: Understanding of general recommendations for a balanced deficit meal plan, which promotes 1-2 lb weight loss per week and includes a negative energy balance of [phone]  kcal/d;Understanding of distribution of calorie intake throughout the day with the consumption of 4-5 meals/snacks;Understanding recommendations for meals to include 15-35% energy as protein, 25-35% energy from fat, 35-60% energy from carbohydrates, less than 268m of dietary cholesterol, 20-35 gm of total fiber daily    Hypertension  Yes    Intervention  Provide education on lifestyle modifcations including regular physical activity/exercise, weight management, moderate sodium restriction and increased consumption of fresh fruit, vegetables, and low fat dairy, alcohol moderation, and smoking cessation.;Monitor prescription use compliance.    Expected Outcomes  Short Term: Continued assessment and intervention until BP is < 140/975mHG in hypertensive participants. < 130/8057mG in hypertensive participants with diabetes, heart failure or chronic kidney disease.;Long Term: Maintenance of blood pressure at goal levels.    Lipids  Yes    Intervention  Provide education and support for participant on nutrition & aerobic/resistive exercise along with prescribed medications to achieve LDL <65m56mDL >40mg55m Expected Outcomes  Short Term: Participant states understanding of desired cholesterol values and is compliant with medications prescribed. Participant is following exercise prescription and nutrition guidelines.;Long Term: Cholesterol controlled with medications as prescribed, with individualized exercise RX and with personalized nutrition plan. Value goals: LDL < 65mg,10m > 40 mg.       Core Components/Risk Factors/Patient Goals Review:    Core Components/Risk Factors/Patient Goals at Discharge (Final Review):    ITP Comments: ITP Comments    Row Name 03/26/19 1039           ITP Comments  Virtual orientation completed today.  [facility]  Pt is scheduled for EP eval on Thurs 10/22 at 130pm.          Comments: Initial ITP

## 2019-03-28 NOTE — Patient Instructions (Signed)
Patient Instructions  Patient Details  Name: Justin Lin MRN: [id number] Date of Birth: [birth year] Referring Provider:  Isaias Cowman, MD  Below are your personal goals for exercise, nutrition, and risk factors. Our goal is to help you stay on track towards obtaining and maintaining these goals. We will be discussing your progress on these goals with you throughout the program.  Initial Exercise Prescription: Initial Exercise Prescription - 03/28/19 1500      Date of Initial Exercise RX and Referring Provider   Date  03/28/19    Referring Provider  Paraschos      Treadmill   MPH  2.5    Grade  1    Minutes  15    METs  2.7      Recumbant Bike   Level  2    RPM  80    Watts  40    Minutes  15    METs  3      NuStep   Level  3    SPM  80    Minutes  15    METs  3      Elliptical   Level  1    Speed  3.5    Minutes  15    METs  3      REL-XR   Level  2    Watts  40    Speed  50    Minutes  15    METs  3      Prescription Details   Duration  Progress to 30 minutes of continuous aerobic without signs/symptoms of physical distress      Intensity   THRR 40-80% of Max Heartrate  113-145    Ratings of Perceived Exertion  11-15    Perceived Dyspnea  0-4      Progression   Progression  Continue progressive overload as per policy without signs/symptoms or physical distress.      Resistance Training   Training Prescription  Yes    Weight  3    Reps  10-15       Exercise Goals: Frequency: Be able to perform aerobic exercise two to three times per week in program working toward 2-5 days per week of home exercise.  Intensity: Work with a perceived exertion of 11 (fairly light) - 15 (hard) while following your exercise prescription.  We will make changes to your prescription with you as you progress through the program.   Duration: Be able to do 30 to 45 minutes of continuous aerobic exercise in addition to a 5 minute warm-up and a 5 minute cool-down  routine.   Nutrition Goals: Your personal nutrition goals will be established when you do your nutrition analysis with the dietician.  The following are general nutrition guidelines to follow: Cholesterol < 200mg /day Sodium < 1500mg /day Fiber: Men over 50 yrs - 30 grams per day  Personal Goals: Personal Goals and Risk Factors at Admission - 03/26/19 1048      Core Components/Risk Factors/Patient Goals on Admission    Weight Management  Yes;Obesity;Weight Loss    Intervention  Weight Management: Develop a combined nutrition and exercise program designed to reach desired caloric intake, while maintaining appropriate intake of nutrient and fiber, sodium and fats, and appropriate energy expenditure required for the weight goal.;Weight Management: Provide education and appropriate resources to help participant work on and attain dietary goals.;Weight Management/Obesity: Establish reasonable short term and long term weight goals.;Obesity: Provide education and appropriate resources to  help participant work on and attain dietary goals.    Expected Outcomes  Short Term: Continue to assess and modify interventions until short term weight is achieved;Long Term: Adherence to nutrition and physical activity/exercise program aimed toward attainment of established weight goal;Weight Loss: Understanding of general recommendations for a balanced deficit meal plan, which promotes 1-2 lb weight loss per week and includes a negative energy balance of [phone] kcal/d;Understanding of distribution of calorie intake throughout the day with the consumption of 4-5 meals/snacks;Understanding recommendations for meals to include 15-35% energy as protein, 25-35% energy from fat, 35-60% energy from carbohydrates, less than 200mg  of dietary cholesterol, 20-35 gm of total fiber daily    Hypertension  Yes    Intervention  Provide education on lifestyle modifcations including regular physical activity/exercise, weight management,  moderate sodium restriction and increased consumption of fresh fruit, vegetables, and low fat dairy, alcohol moderation, and smoking cessation.;Monitor prescription use compliance.    Expected Outcomes  Short Term: Continued assessment and intervention until BP is < 140/9190mm HG in hypertensive participants. < 130/2880mm HG in hypertensive participants with diabetes, heart failure or chronic kidney disease.;Long Term: Maintenance of blood pressure at goal levels.    Lipids  Yes    Intervention  Provide education and support for participant on nutrition & aerobic/resistive exercise along with prescribed medications to achieve LDL 70mg , HDL >40mg .    Expected Outcomes  Short Term: Participant states understanding of desired cholesterol values and is compliant with medications prescribed. Participant is following exercise prescription and nutrition guidelines.;Long Term: Cholesterol controlled with medications as prescribed, with individualized exercise RX and with personalized nutrition plan. Value goals: LDL < 70mg , HDL > 40 mg.       Tobacco Use Initial Evaluation: Social History   Tobacco Use  Smoking Status Former Smoker  . Packs/day: 0.25  . Years: 1.00  . Pack years: 0.25  . Types: Cigarettes  Smokeless Tobacco Never Used  Tobacco Comment   6515-58 year old, hasn't smoked in 40 years    Exercise Goals and Review: Exercise Goals    Row Name 03/28/19 1411             Exercise Goals   Increase Physical Activity  Yes       Intervention  Provide advice, education, support and counseling about physical activity/exercise needs.;Develop an individualized exercise prescription for aerobic and resistive training based on initial evaluation findings, risk stratification, comorbidities and participant's personal goals.       Expected Outcomes  Short Term: Attend rehab on a regular basis to increase amount of physical activity.;Long Term: Add in home exercise to make exercise part of routine and  to increase amount of physical activity.;Long Term: Exercising regularly at least 3-5 days a week.       Increase Strength and Stamina  Yes       Intervention  Provide advice, education, support and counseling about physical activity/exercise needs.;Develop an individualized exercise prescription for aerobic and resistive training based on initial evaluation findings, risk stratification, comorbidities and participant's personal goals.       Expected Outcomes  Short Term: Increase workloads from initial exercise prescription for resistance, speed, and METs.;Short Term: Perform resistance training exercises routinely during rehab and add in resistance training at home;Long Term: Improve cardiorespiratory fitness, muscular endurance and strength as measured by increased METs and functional capacity (6MWT)       Able to understand and use rate of perceived exertion (RPE) scale  Yes  Intervention  Provide education and explanation on how to use RPE scale       Expected Outcomes  Short Term: Able to use RPE daily in rehab to express subjective intensity level;Long Term:  Able to use RPE to guide intensity level when exercising independently       Able to understand and use Dyspnea scale  Yes       Intervention  Provide education and explanation on how to use Dyspnea scale       Expected Outcomes  Short Term: Able to use Dyspnea scale daily in rehab to express subjective sense of shortness of breath during exertion;Long Term: Able to use Dyspnea scale to guide intensity level when exercising independently       Knowledge and understanding of Target Heart Rate Range (THRR)  Yes       Intervention  Provide education and explanation of THRR including how the numbers were predicted and where they are located for reference       Expected Outcomes  Short Term: Able to state/look up THRR;Short Term: Able to use daily as guideline for intensity in rehab;Long Term: Able to use THRR to govern intensity when  exercising independently       Able to check pulse independently  Yes       Intervention  Provide education and demonstration on how to check pulse in carotid and radial arteries.;Review the importance of being able to check your own pulse for safety during independent exercise       Expected Outcomes  Short Term: Able to explain why pulse checking is important during independent exercise;Long Term: Able to check pulse independently and accurately       Understanding of Exercise Prescription  Yes       Intervention  Provide education, explanation, and written materials on patient's individual exercise prescription       Expected Outcomes  Short Term: Able to explain program exercise prescription;Long Term: Able to explain home exercise prescription to exercise independently          Copy of goals given to participant.

## 2019-04-01 ENCOUNTER — Other Ambulatory Visit: Payer: Self-pay

## 2019-04-01 ENCOUNTER — Encounter: Payer: BC Managed Care – PPO | Admitting: *Deleted

## 2019-04-01 DIAGNOSIS — I213 ST elevation (STEMI) myocardial infarction of unspecified site: Secondary | ICD-10-CM | POA: Diagnosis not present

## 2019-04-01 DIAGNOSIS — I2101 ST elevation (STEMI) myocardial infarction involving left main coronary artery: Secondary | ICD-10-CM

## 2019-04-01 DIAGNOSIS — Z955 Presence of coronary angioplasty implant and graft: Secondary | ICD-10-CM

## 2019-04-01 NOTE — Progress Notes (Signed)
Daily Session Note  Patient Details  Name: Justin Lin MRN: [id number] Date of Birth: 22-Jun-1960 Referring Provider:     Cardiac Rehab from 03/28/2019 in [facility] Cardiac and Pulmonary Rehab  Referring Provider  Paraschos      Encounter Date: 04/01/2019  Check In: Session Check In - 04/01/19 1417      Check-In   Supervising physician immediately available to respond to emergencies  See telemetry face sheet for immediately available ER MD    Location  [facility]-Cardiac & Pulmonary Rehab    Staff Present  Renita Papa, RN Moises Blood, BS, ACSM CEP, Exercise Physiologist;Joseph Tessie Fass RCP,RRT,BSRT    Virtual Visit  No    Medication changes reported      No    Fall or balance concerns reported     No    Warm-up and Cool-down  Performed on first and last piece of equipment    Resistance Training Performed  Yes    VAD Patient?  No    PAD/SET Patient?  No      Pain Assessment   Currently in Pain?  No/denies          Social History   Tobacco Use  Smoking Status Former Smoker  . Packs/day: 0.25  . Years: 1.00  . Pack years: 0.25  . Types: Cigarettes  Smokeless Tobacco Never Used  Tobacco Comment   57-20 year old, hasn't smoked in 40 years    Goals Met:  Independence with exercise equipment Exercise tolerated well No report of cardiac concerns or symptoms Strength training completed today  Goals Unmet:  Not Applicable  Comments: First full day of exercise!  Patient was oriented to gym and equipment including functions, settings, policies, and procedures.  Patient's individual exercise prescription and treatment plan were reviewed.  All starting workloads were established based on the results of the 6 minute walk test done at initial orientation visit.  The plan for exercise progression was also introduced and progression will be customized based on patient's performance and goals.    Dr. Emily Filbert is Medical Director for [place] and  [facility].

## 2019-04-03 ENCOUNTER — Encounter: Payer: BC Managed Care – PPO | Admitting: *Deleted

## 2019-04-03 ENCOUNTER — Other Ambulatory Visit: Payer: Self-pay

## 2019-04-03 DIAGNOSIS — I2101 ST elevation (STEMI) myocardial infarction involving left main coronary artery: Secondary | ICD-10-CM

## 2019-04-03 DIAGNOSIS — I213 ST elevation (STEMI) myocardial infarction of unspecified site: Secondary | ICD-10-CM | POA: Diagnosis not present

## 2019-04-03 DIAGNOSIS — Z955 Presence of coronary angioplasty implant and graft: Secondary | ICD-10-CM

## 2019-04-03 NOTE — Progress Notes (Signed)
f °

## 2019-04-03 NOTE — Progress Notes (Signed)
Daily Session Note  Patient Details  Name: Justin Lin MRN: [id number] Date of Birth: 16-Apr-1961 Referring Provider:     Cardiac Rehab from 03/28/2019 in [facility] Cardiac and Pulmonary Rehab  Referring Provider  Paraschos      Encounter Date: 04/03/2019  Check In: Session Check In - 04/03/19 1413      Check-In   Supervising physician immediately available to respond to emergencies  See telemetry face sheet for immediately available ER MD    Location  [facility]-Cardiac & Pulmonary Rehab    Staff Present  Renita Papa, RN Vickki Hearing, BA, ACSM CEP, Exercise Physiologist;Joseph Tessie Fass RCP,RRT,BSRT    Virtual Visit  No    Medication changes reported      Yes    Comments  changed lisinopril to losartan    Fall or balance concerns reported     No    Warm-up and Cool-down  Performed on first and last piece of equipment    Resistance Training Performed  Yes    VAD Patient?  No    PAD/SET Patient?  No      Pain Assessment   Currently in Pain?  No/denies          Social History   Tobacco Use  Smoking Status Former Smoker  . Packs/day: 0.25  . Years: 1.00  . Pack years: 0.25  . Types: Cigarettes  Smokeless Tobacco Never Used  Tobacco Comment   21-72 year old, hasn't smoked in 40 years    Goals Met:  Independence with exercise equipment Exercise tolerated well No report of cardiac concerns or symptoms Strength training completed today  Goals Unmet:  Not Applicable  Comments: Pt able to follow exercise prescription today without complaint.  Will continue to monitor for progression.    Dr. Emily Filbert is Medical Director for [place] and [facility].

## 2019-04-04 ENCOUNTER — Encounter: Payer: BC Managed Care – PPO | Admitting: *Deleted

## 2019-04-04 DIAGNOSIS — I2101 ST elevation (STEMI) myocardial infarction involving left main coronary artery: Secondary | ICD-10-CM

## 2019-04-04 DIAGNOSIS — I213 ST elevation (STEMI) myocardial infarction of unspecified site: Secondary | ICD-10-CM | POA: Diagnosis not present

## 2019-04-04 DIAGNOSIS — Z955 Presence of coronary angioplasty implant and graft: Secondary | ICD-10-CM

## 2019-04-04 NOTE — Progress Notes (Signed)
Daily Session Note  Patient Details  Name: Bradyn Soward MRN: [id number] Date of Birth: [birth year] Referring Provider:     Cardiac Rehab from 03/28/2019 in [facility] Cardiac and Pulmonary Rehab  Referring Provider  Paraschos      Encounter Date: 04/04/2019  Check In: Session Check In - 04/04/19 1405      Check-In   Supervising physician immediately available to respond to emergencies  See telemetry face sheet for immediately available ER MD    Location  [facility]-Cardiac & Pulmonary Rehab    Staff Present  Renita Papa, RN BSN;Jessica Luan Pulling, MA, RCEP, CCRP, CCET;Joseph [place] RCP,RRT,BSRT    Virtual Visit  No    Medication changes reported      No    Fall or balance concerns reported     No    Warm-up and Cool-down  Performed on first and last piece of equipment    Resistance Training Performed  Yes    VAD Patient?  No    PAD/SET Patient?  No      Pain Assessment   Currently in Pain?  No/denies          Social History   Tobacco Use  Smoking Status Former Smoker  . Packs/day: 0.25  . Years: 1.00  . Pack years: 0.25  . Types: Cigarettes  Smokeless Tobacco Never Used  Tobacco Comment   59-35 year old, hasn't smoked in 40 years    Goals Met:  Independence with exercise equipment Exercise tolerated well No report of cardiac concerns or symptoms Strength training completed today  Goals Unmet:  Not Applicable  Comments: Pt able to follow exercise prescription today without complaint.  Will continue to monitor for progression.    Dr. Emily Filbert is Medical Director for [place] and [facility].

## 2019-04-08 ENCOUNTER — Other Ambulatory Visit: Payer: Self-pay

## 2019-04-08 ENCOUNTER — Encounter: Payer: BC Managed Care – PPO | Attending: Cardiology | Admitting: *Deleted

## 2019-04-08 DIAGNOSIS — I2101 ST elevation (STEMI) myocardial infarction involving left main coronary artery: Secondary | ICD-10-CM

## 2019-04-08 DIAGNOSIS — I213 ST elevation (STEMI) myocardial infarction of unspecified site: Secondary | ICD-10-CM | POA: Insufficient documentation

## 2019-04-08 DIAGNOSIS — Z955 Presence of coronary angioplasty implant and graft: Secondary | ICD-10-CM

## 2019-04-08 NOTE — Progress Notes (Signed)
Daily Session Note  Patient Details  Name: Justin Lin MRN: [id number] Date of Birth: 26-Apr-1961 Referring Provider:     Cardiac Rehab from 03/28/2019 in [facility] Cardiac and Pulmonary Rehab  Referring Provider  Paraschos      Encounter Date: 04/08/2019  Check In: Session Check In - 04/08/19 1409      Check-In   Supervising physician immediately available to respond to emergencies  See telemetry face sheet for immediately available ER MD    Location  [facility]-Cardiac & Pulmonary Rehab    Staff Present  Renita Papa, RN Moises Blood, BS, ACSM CEP, Exercise Physiologist;Joseph Tessie Fass RCP,RRT,BSRT    Virtual Visit  No    Medication changes reported      No    Fall or balance concerns reported     No    Warm-up and Cool-down  Performed on first and last piece of equipment    Resistance Training Performed  Yes    VAD Patient?  No    PAD/SET Patient?  No      Pain Assessment   Currently in Pain?  No/denies          Social History   Tobacco Use  Smoking Status Former Smoker  . Packs/day: 0.25  . Years: 1.00  . Pack years: 0.25  . Types: Cigarettes  Smokeless Tobacco Never Used  Tobacco Comment   59-54 year old, hasn't smoked in 40 years    Goals Met:  Independence with exercise equipment Exercise tolerated well No report of cardiac concerns or symptoms Strength training completed today  Goals Unmet:  Not Applicable  Comments: Pt able to follow exercise prescription today without complaint.  Will continue to monitor for progression.    Dr. Emily Filbert is Medical Director for [place] and [facility].

## 2019-04-10 ENCOUNTER — Encounter: Payer: BC Managed Care – PPO | Admitting: *Deleted

## 2019-04-10 ENCOUNTER — Encounter: Payer: Self-pay | Admitting: *Deleted

## 2019-04-10 ENCOUNTER — Other Ambulatory Visit: Payer: Self-pay

## 2019-04-10 DIAGNOSIS — Z955 Presence of coronary angioplasty implant and graft: Secondary | ICD-10-CM

## 2019-04-10 DIAGNOSIS — I2101 ST elevation (STEMI) myocardial infarction involving left main coronary artery: Secondary | ICD-10-CM

## 2019-04-10 DIAGNOSIS — I213 ST elevation (STEMI) myocardial infarction of unspecified site: Secondary | ICD-10-CM | POA: Diagnosis not present

## 2019-04-10 NOTE — Progress Notes (Signed)
Daily Session Note  Patient Details  Name: Justin Lin MRN: [id number] Date of Birth: [birth year] Referring Provider:     Cardiac Rehab from 03/28/2019 in [facility] Cardiac and Pulmonary Rehab  Referring Provider  Paraschos      Encounter Date: 04/10/2019  Check In: Session Check In - 04/10/19 1404      Check-In   Supervising physician immediately available to respond to emergencies  See telemetry face sheet for immediately available ER MD    Location  [facility]-Cardiac & Pulmonary Rehab    Staff Present  Renita Papa, RN Vickki Hearing, BA, ACSM CEP, Exercise Physiologist;Joseph Tessie Fass RCP,RRT,BSRT    Virtual Visit  No    Medication changes reported      No    Warm-up and Cool-down  Performed on first and last piece of equipment    Resistance Training Performed  Yes    VAD Patient?  No    PAD/SET Patient?  No      Pain Assessment   Currently in Pain?  No/denies          Social History   Tobacco Use  Smoking Status Former Smoker  . Packs/day: 0.25  . Years: 1.00  . Pack years: 0.25  . Types: Cigarettes  Smokeless Tobacco Never Used  Tobacco Comment   34-10 year old, hasn't smoked in 40 years    Goals Met:  Independence with exercise equipment Exercise tolerated well No report of cardiac concerns or symptoms Strength training completed today  Goals Unmet:  Not Applicable  Comments: Pt able to follow exercise prescription today without complaint.  Will continue to monitor for progression.    Dr. Emily Filbert is Medical Director for [place] and [facility].

## 2019-04-10 NOTE — Progress Notes (Signed)
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  acetaminophen (TYLENOL) 325 MG tablet, Take 2 tablets (650 mg total) by mouth every 4 (four) hours as needed for headache or mild pain., Disp: , Rfl:  .  amiodarone (PACERONE) 200 MG tablet, Take 2 tablets (400 mg total) by mouth 2 (two) times daily. Take 400 mg twice a day for 1 week followed by 200 mg twice a day, Disp: 90 tablet, Rfl: 0 .  aspirin EC 81 MG tablet, Take 1 tablet (81 mg total) by mouth daily., Disp: 150 tablet, Rfl: 2 .  atorvastatin (LIPITOR) 80 MG tablet, Take 1 tablet (80 mg total) by mouth daily at 6 PM., Disp: 30 tablet, Rfl: 0 .  lisinopril (ZESTRIL) 2.5 MG tablet, Take 1 tablet (2.5 mg total) by mouth daily., Disp: 30 tablet, Rfl: 0 .  loratadine (CLARITIN) 10 MG tablet, Take 10 mg by mouth daily as needed for allergies., Disp: , Rfl:  .  metoprolol succinate (TOPROL-XL) 50 MG 24 hr tablet, Take 1 tablet (50 mg total) by mouth daily. Take with or immediately following a meal., Disp: 30 tablet, Rfl: 0 .  nitroGLYCERIN (NITROSTAT) 0.4 MG SL tablet, Place 1 tablet (0.4 mg total) under the tongue every 5 (five) minutes x 3 doses as needed for chest pain., Disp: 10 tablet, Rfl: 0 .  ticagrelor (BRILINTA) 90 MG TABS tablet, Take 1 tablet (90 mg total) by mouth 2 (two) times daily., Disp: 60 tablet, Rfl: 0  Past Medical History: Past Medical History:  Diagnosis Date  . Kidney stone     Tobacco  Use: Social History   Tobacco Use  Smoking Status Former Smoker  . Packs/day: 0.25  . Years: 1.00  . Pack years: 0.25  . Types: Cigarettes  Smokeless Tobacco Never Used  Tobacco Comment   20-89 year old, hasn't smoked in 40 years    Labs: Recent Review Flowsheet Data    Labs for ITP Cardiac and Pulmonary Rehab Latest Ref Rng & Units 03/21/2019 03/22/2019   Cholestrol 0 - 200 mg/dL 206(H) 195   LDLCALC 0 - 99 mg/dL 145(H) 143(H)   HDL >40 mg/dL 37(L) 31(L)   Trlycerides <150 mg/dL 119 103       Exercise Target Goals: Exercise Program Goal: Individual exercise prescription set using results from initial 6 min walk test and THRR while considering  patient's activity barriers and safety.   Exercise Prescription Goal: Initial exercise prescription builds to 30-45 minutes a day of aerobic activity, 2-3 days per week.  Home exercise guidelines will be given to patient during program as part of exercise prescription that the participant will acknowledge.  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 HR  112 bpm     Max Ex. BP  120/90     2 Minute Post BP  118/82        Oxygen Initial Assessment:   Oxygen Re-Evaluation:   Oxygen Discharge (Final Oxygen Re-Evaluation):   Initial Exercise Prescription: Initial Exercise Prescription - 03/28/19 1500      Date of Initial Exercise RX and Referring Provider    Date  03/28/19    Referring Provider  Paraschos      Treadmill   MPH  2.5    Grade  1    Minutes  15    METs  2.7      Recumbant Bike   Level  2    RPM  80    Watts  40    Minutes  15    METs  3      NuStep   Level  3    SPM  80    Minutes  15    METs  3      Elliptical   Level  1    Speed  3.5    Minutes  15    METs  3      REL-XR   Level  2    Watts  40    Speed  50    Minutes  15    METs  3      Prescription Details   Duration  Progress to 30 minutes of continuous aerobic without signs/symptoms of physical distress      Intensity   THRR 40-80% of Max Heartrate  113-145    Ratings of Perceived Exertion  11-15    Perceived Dyspnea  0-4      Progression   Progression  Continue progressive overload as per policy without signs/symptoms or physical distress.      Resistance Training   Training Prescription  Yes    Weight  3    Reps  10-15       Perform Capillary Blood Glucose checks as needed.  Exercise Prescription Changes:   Exercise Comments:   Exercise Goals and Review: Exercise Goals    Row Name 03/28/19 1411             Exercise Goals   Increase Physical Activity  Yes       Intervention  Provide advice, education, support and counseling about physical activity/exercise needs.;Develop an individualized exercise prescription for aerobic and resistive training based on initial evaluation findings, risk stratification, comorbidities and participant's personal goals.       Expected Outcomes  Short Term: Attend rehab on a regular basis to increase amount of physical activity.;Long Term: Add in home exercise to make exercise part of routine and to increase amount of physical activity.;Long Term: Exercising regularly at least 3-5 days a week.  Increase Strength and Stamina  Yes       Intervention  Provide advice, education, support and counseling about physical activity/exercise needs.;Develop an individualized exercise prescription for  aerobic and resistive training based on initial evaluation findings, risk stratification, comorbidities and participant's personal goals.       Expected Outcomes  Short Term: Increase workloads from initial exercise prescription for resistance, speed, and METs.;Short Term: Perform resistance training exercises routinely during rehab and add in resistance training at home;Long Term: Improve cardiorespiratory fitness, muscular endurance and strength as measured by increased METs and functional capacity (6MWT)       Able to understand and use rate of perceived exertion (RPE) scale  Yes       Intervention  Provide education and explanation on how to use RPE scale       Expected Outcomes  Short Term: Able to use RPE daily in rehab to express subjective intensity level;Long Term:  Able to use RPE to guide intensity level when exercising independently       Able to understand and use Dyspnea scale  Yes       Intervention  Provide education and explanation on how to use Dyspnea scale       Expected Outcomes  Short Term: Able to use Dyspnea scale daily in rehab to express subjective sense of shortness of breath during exertion;Long Term: Able to use Dyspnea scale to guide intensity level when exercising independently       Knowledge and understanding of Target Heart Rate Range (THRR)  Yes       Intervention  Provide education and explanation of THRR including how the numbers were predicted and where they are located for reference       Expected Outcomes  Short Term: Able to state/look up THRR;Short Term: Able to use daily as guideline for intensity in rehab;Long Term: Able to use THRR to govern intensity when exercising independently       Able to check pulse independently  Yes       Intervention  Provide education and demonstration on how to check pulse in carotid and radial arteries.;Review the importance of being able to check your own pulse for safety during independent exercise       Expected Outcomes   Short Term: Able to explain why pulse checking is important during independent exercise;Long Term: Able to check pulse independently and accurately       Understanding of Exercise Prescription  Yes       Intervention  Provide education, explanation, and written materials on patient's individual exercise prescription       Expected Outcomes  Short Term: Able to explain program exercise prescription;Long Term: Able to explain home exercise prescription to exercise independently          Exercise Goals Re-Evaluation : Exercise Goals Re-Evaluation    Row Name 04/01/19 1418             Exercise Goal Re-Evaluation   Exercise Goals Review  Able to understand and use rate of perceived exertion (RPE) scale;Increase Physical Activity;Knowledge and understanding of Target Heart Rate Range (THRR);Understanding of Exercise Prescription;Able to check pulse independently;Increase Strength and Stamina       Comments  Reviewed RPE scale, THR and program prescription with pt today.  Pt voiced understanding and was given a copy of goals to take home.       Expected Outcomes  Short: Use RPE daily to regulate intensity. Long: Follow program prescription in THR.  Discharge Exercise Prescription (Final Exercise Prescription Changes):   Nutrition:  Target Goals: Understanding of nutrition guidelines, daily intake of sodium <1558m, cholesterol <2065m calories 30% from fat and 7% or less from saturated fats, daily to have 5 or more servings of fruits and vegetables.  Biometrics:    Nutrition Therapy Plan and Nutrition Goals:   Nutrition Assessments:   Nutrition Goals Re-Evaluation:   Nutrition Goals Discharge (Final Nutrition Goals Re-Evaluation):   Psychosocial: Target Goals: Acknowledge presence or absence of significant depression and/or stress, maximize coping skills, provide positive support system. Participant is able to verbalize types and ability to use techniques and skills  needed for reducing stress and depression.   Initial Review & Psychosocial Screening: Initial Psych Review & Screening - 03/26/19 1012      Initial Review   Current issues with  Current Stress Concerns;Current Anxiety/Panic    Source of Stress Concerns  Family    Comments  Has a son 355o with down syndrome that lives with him.  He is a single parent and manages his care.  His son's mom does not support him in any way and that was why he retired early.      Family Dynamics   Good Support System?  Yes   BeJacqlyn Larsengirlfriend for 30 years, JuLisabeth Devoidhis son's grandmother, and brother and sister that all live nearby   Comments  He has been a little more emotional with everything since getting home.      Barriers   Psychosocial barriers to participate in program  The patient should benefit from training in stress management and relaxation.;Psychosocial barriers identified (see note)      Screening Interventions   Interventions  To provide support and resources with identified psychosocial needs;Provide feedback about the scores to participant    Expected Outcomes  Short Term goal: Utilizing psychosocial counselor, staff and physician to assist with identification of specific Stressors or current issues interfering with healing process. Setting desired goal for each stressor or current issue identified.;Long Term Goal: Stressors or current issues are controlled or eliminated.;Short Term goal: Identification and review with participant of any Quality of Life or Depression concerns found by scoring the questionnaire.;Long Term goal: The participant improves quality of Life and PHQ9 Scores as seen by post scores and/or verbalization of changes       Quality of Life Scores:  Quality of Life - 03/28/19 1412      Quality of Life   Select  Quality of Life      Quality of Life Scores   Health/Function Pre  20.1 %    Socioeconomic Pre  24 %    Psych/Spiritual Pre  24 %    Family Pre  27.6 %    GLOBAL  Pre  22.86 %      Scores of 19 and below usually indicate a poorer quality of life in these areas.  A difference of  2-3 points is a clinically meaningful difference.  A difference of 2-3 points in the total score of the Quality of Life Index has been associated with significant improvement in overall quality of life, self-image, physical symptoms, and general health in studies assessing change in quality of life.  PHQ-9: Recent Review Flowsheet Data    [facility]   Decreased Interest 0   Down, Depressed, Hopeless 0   PHQ - 2 Score 0   Altered sleeping 3   Tired, decreased energy 1   Change in appetite 2  Feeling bad or failure about yourself  1   Trouble concentrating 0   Moving slowly or fidgety/restless 1   Suicidal thoughts 0   PHQ-9 Score 8   Difficult doing work/chores Somewhat difficult     Interpretation of Total Score  Total Score Depression Severity:  1-4 = Minimal depression, 5-9 = Mild depression, 10-14 = Moderate depression, 15-19 = Moderately severe depression, 20-27 = Severe depression   Psychosocial Evaluation and Intervention: Psychosocial Evaluation - 03/26/19 1042      Psychosocial Evaluation & Interventions   Interventions  Encouraged to exercise with the program and follow exercise prescription;Stress management education    Comments  Mr. Shin has been more emotional than his normal since getting home from his MI.  He has mentioned that this is not his normal.  We talked about how it is normal to feel this way especially after a heart event and coming home from hospital.  He has a son with down syndrome that lives with him.  He is the primary caregiver for his son as his son's mother no longer offers support finanacially or physically.  He retired early to better manage his son's care at home.  He also has a long time girlfriend that he says they have been together so long that they are practically married.  He has other family that  lives near by in the county that offer some help.  His son's maternal grandmother helps him from time to time as well.  He noted that he does have some anxiety brought on by caring for his son, but currently is managing and not taking anything. Offered that is he feels that he may need something we can help facilitate that for him.    Expected Outcomes  Short: Asess QOL and PHQ9 on Thursday.  Long: Continue to follow up with pschosocial issues and get him to exercise for mental boost and self care.    Continue Psychosocial Services   Follow up required by staff       Psychosocial Re-Evaluation:   Psychosocial Discharge (Final Psychosocial Re-Evaluation):   Vocational Rehabilitation: Provide vocational rehab assistance to qualifying candidates.   Vocational Rehab Evaluation & Intervention: Vocational Rehab - 03/26/19 1011      Initial Vocational Rehab Evaluation & Intervention   Assessment shows need for Vocational Rehabilitation  No   currently retired      Education: Education Goals: Education classes will be provided on a variety of topics geared toward better understanding of heart health and risk factor modification. Participant will state understanding/return demonstration of topics presented as noted by education test scores.  Learning Barriers/Preferences: Learning Barriers/Preferences - 03/26/19 1012      Learning Barriers/Preferences   Learning Barriers  Sight   glasses   Learning Preferences  None       Education Topics:  AED/CPR: - Group verbal and written instruction with the use of models to demonstrate the basic use of the AED with the basic ABC's of resuscitation.   General Nutrition Guidelines/Fats and Fiber: -Group instruction provided by verbal, written material, models and posters to present the general guidelines for heart healthy nutrition. Gives an explanation and review of dietary fats and fiber.   Controlling Sodium/Reading Food Labels: -Group  verbal and written material supporting the discussion of sodium use in heart healthy nutrition. Review and explanation with models, verbal and written materials for utilization of the food label.   Exercise Physiology & General Exercise Guidelines: - Group verbal and written  instruction with models to review the exercise physiology of the cardiovascular system and associated critical values. Provides general exercise guidelines with specific guidelines to those with heart or lung disease.    Aerobic Exercise & Resistance Training: - Gives group verbal and written instruction on the various components of exercise. Focuses on aerobic and resistive training programs and the benefits of this training and how to safely progress through these programs..   Flexibility, Balance, Mind/Body Relaxation: Provides group verbal/written instruction on the benefits of flexibility and balance training, including mind/body exercise modes such as yoga, pilates and tai chi.  Demonstration and skill practice provided.   Stress and Anxiety: - Provides group verbal and written instruction about the health risks of elevated stress and causes of high stress.  Discuss the correlation between heart/lung disease and anxiety and treatment options. Review healthy ways to manage with stress and anxiety.   Depression: - Provides group verbal and written instruction on the correlation between heart/lung disease and depressed mood, treatment options, and the stigmas associated with seeking treatment.   Anatomy & Physiology of the Heart: - Group verbal and written instruction and models provide basic cardiac anatomy and physiology, with the coronary electrical and arterial systems. Review of Valvular disease and Heart Failure   Cardiac Procedures: - Group verbal and written instruction to review commonly prescribed medications for heart disease. Reviews the medication, class of the drug, and side effects. Includes the  steps to properly store meds and maintain the prescription regimen. (beta blockers and nitrates)   Cardiac Medications I: - Group verbal and written instruction to review commonly prescribed medications for heart disease. Reviews the medication, class of the drug, and side effects. Includes the steps to properly store meds and maintain the prescription regimen.   Cardiac Medications II: -Group verbal and written instruction to review commonly prescribed medications for heart disease. Reviews the medication, class of the drug, and side effects. (all other drug classes)    Go Sex-Intimacy & Heart Disease, Get SMART - Goal Setting: - Group verbal and written instruction through game format to discuss heart disease and the return to sexual intimacy. Provides group verbal and written material to discuss and apply goal setting through the application of the S.M.A.R.T. Method.   Other Matters of the Heart: - Provides group verbal, written materials and models to describe Stable Angina and Peripheral Artery. Includes description of the disease process and treatment options available to the cardiac patient.   Exercise & Equipment Safety: - Individual verbal instruction and demonstration of equipment use and safety with use of the equipment.   Cardiac Rehab from 03/28/2019 in [facility] Cardiac and Pulmonary Rehab  Date  03/28/19  Educator  [place]  Instruction Review Code  1- Verbalizes Understanding      Infection Prevention: - Provides verbal and written material to individual with discussion of infection control including proper hand washing and proper equipment cleaning during exercise session.   Cardiac Rehab from 03/28/2019 in [facility] Cardiac and Pulmonary Rehab  Date  03/28/19  Educator  [place]  Instruction Review Code  1- Verbalizes Understanding      Falls Prevention: - Provides verbal and written material to individual with discussion of falls prevention and safety.   Cardiac Rehab from  03/28/2019 in [facility] Cardiac and Pulmonary Rehab  Date  03/28/19  Educator  [place]  Instruction Review Code  1- Verbalizes Understanding      Diabetes: - Individual verbal and written instruction to review signs/symptoms of diabetes, desired ranges  of glucose level fasting, after meals and with exercise. Acknowledge that pre and post exercise glucose checks will be done for 3 sessions at entry of program.   Know Your Numbers and Risk Factors: -Group verbal and written instruction about important numbers in your health.  Discussion of what are risk factors and how they play a role in the disease process.  Review of Cholesterol, Blood Pressure, Diabetes, and BMI and the role they play in your overall health.   Sleep Hygiene: -Provides group verbal and written instruction about how sleep can affect your health.  Define sleep hygiene, discuss sleep cycles and impact of sleep habits. Review good sleep hygiene tips.    Other: -Provides group and verbal instruction on various topics (see comments)   Knowledge Questionnaire Score: Knowledge Questionnaire Score - 03/28/19 1413      Knowledge Questionnaire Score   Pre Score  24/26 exercise and nutrition       Core Components/Risk Factors/Patient Goals at Admission: Personal Goals and Risk Factors at Admission - 03/26/19 1048      Core Components/Risk Factors/Patient Goals on Admission    Weight Management  Yes;Obesity;Weight Loss    Intervention  Weight Management: Develop a combined nutrition and exercise program designed to reach desired caloric intake, while maintaining appropriate intake of nutrient and fiber, sodium and fats, and appropriate energy expenditure required for the weight goal.;Weight Management: Provide education and appropriate resources to help participant work on and attain dietary goals.;Weight Management/Obesity: Establish reasonable short term and long term weight goals.;Obesity: Provide education and appropriate resources  to help participant work on and attain dietary goals.    Expected Outcomes  Short Term: Continue to assess and modify interventions until short term weight is achieved;Long Term: Adherence to nutrition and physical activity/exercise program aimed toward attainment of established weight goal;Weight Loss: Understanding of general recommendations for a balanced deficit meal plan, which promotes 1-2 lb weight loss per week and includes a negative energy balance of [phone] kcal/d;Understanding of distribution of calorie intake throughout the day with the consumption of 4-5 meals/snacks;Understanding recommendations for meals to include 15-35% energy as protein, 25-35% energy from fat, 35-60% energy from carbohydrates, less than 251m of dietary cholesterol, 20-35 gm of total fiber daily    Hypertension  Yes    Intervention  Provide education on lifestyle modifcations including regular physical activity/exercise, weight management, moderate sodium restriction and increased consumption of fresh fruit, vegetables, and low fat dairy, alcohol moderation, and smoking cessation.;Monitor prescription use compliance.    Expected Outcomes  Short Term: Continued assessment and intervention until BP is < 140/973mHG in hypertensive participants. < 130/8079mG in hypertensive participants with diabetes, heart failure or chronic kidney disease.;Long Term: Maintenance of blood pressure at goal levels.    Lipids  Yes    Intervention  Provide education and support for participant on nutrition & aerobic/resistive exercise along with prescribed medications to achieve LDL <76m44mDL >40mg56m Expected Outcomes  Short Term: Participant states understanding of desired cholesterol values and is compliant with medications prescribed. Participant is following exercise prescription and nutrition guidelines.;Long Term: Cholesterol controlled with medications as prescribed, with individualized exercise RX and with personalized nutrition  plan. Value goals: LDL < 76mg,5m > 40 mg.       Core Components/Risk Factors/Patient Goals Review:    Core Components/Risk Factors/Patient Goals at Discharge (Final Review):    ITP Comments: ITP Comments    Row Name 03/26/19 1039 04/01/19 1417 04/10/19 [id number]  ITP Comments  Virtual orientation completed today.  Documentation for diagonsis can be found in [facility] encounter from 10/15.  Pt is scheduled for EP eval on Thurs 10/22 at 130pm.  First full day of exercise!  Patient was oriented to gym and equipment including functions, settings, policies, and procedures.  Patient's individual exercise prescription and treatment plan were reviewed.  All starting workloads were established based on the results of the 6 minute walk test done at initial orientation visit.  The plan for exercise progression was also introduced and progression will be customized based on patient's performance and goals.  30 day review completed. Continue with ITP sent to Dr. Emily Filbert, Medical Director of Cardiac and Pulmonary Rehab for review , changes as needed and signature.  New to program        Comments:

## 2019-04-11 DIAGNOSIS — I2101 ST elevation (STEMI) myocardial infarction involving left main coronary artery: Secondary | ICD-10-CM

## 2019-04-11 DIAGNOSIS — Z955 Presence of coronary angioplasty implant and graft: Secondary | ICD-10-CM

## 2019-04-11 DIAGNOSIS — I213 ST elevation (STEMI) myocardial infarction of unspecified site: Secondary | ICD-10-CM | POA: Diagnosis not present

## 2019-04-11 NOTE — Progress Notes (Signed)
Daily Session Note  Patient Details  Name: Justin Lin MRN: [id number] Date of Birth: 06-28-1960 Referring Provider:     Cardiac Rehab from 03/28/2019 in [facility] Cardiac and Pulmonary Rehab  Referring Provider  Paraschos      Encounter Date: 04/11/2019  Check In: Session Check In - 04/11/19 1417      Check-In   Supervising physician immediately available to respond to emergencies  See telemetry face sheet for immediately available ER MD    Location  [facility]-Cardiac & Pulmonary Rehab    Staff Present  Vida Rigger RN, Vickki Hearing, BA, ACSM CEP, Exercise Physiologist;Joseph Tessie Fass RCP,RRT,BSRT    Virtual Visit  No    Medication changes reported      No    Fall or balance concerns reported     No    Warm-up and Cool-down  Performed on first and last piece of equipment    Resistance Training Performed  Yes    VAD Patient?  No    PAD/SET Patient?  No      Pain Assessment   Currently in Pain?  No/denies    Pain Score  0-No pain          Social History   Tobacco Use  Smoking Status Former Smoker  . Packs/day: 0.25  . Years: 1.00  . Pack years: 0.25  . Types: Cigarettes  Smokeless Tobacco Never Used  Tobacco Comment   18-13 year old, hasn't smoked in 40 years    Goals Met:  Independence with exercise equipment Exercise tolerated well No report of cardiac concerns or symptoms Strength training completed today  Goals Unmet:  Not Applicable  Comments: Pt able to follow exercise prescription today without complaint.  Will continue to monitor for progression.   Dr. Emily Filbert is Medical Director for [place] and [facility].

## 2019-04-15 ENCOUNTER — Encounter: Payer: BC Managed Care – PPO | Admitting: *Deleted

## 2019-04-15 ENCOUNTER — Other Ambulatory Visit: Payer: Self-pay

## 2019-04-15 DIAGNOSIS — I213 ST elevation (STEMI) myocardial infarction of unspecified site: Secondary | ICD-10-CM | POA: Diagnosis not present

## 2019-04-15 DIAGNOSIS — Z955 Presence of coronary angioplasty implant and graft: Secondary | ICD-10-CM

## 2019-04-15 DIAGNOSIS — I2101 ST elevation (STEMI) myocardial infarction involving left main coronary artery: Secondary | ICD-10-CM

## 2019-04-15 NOTE — Progress Notes (Signed)
Daily Session Note  Patient Details  Name: Justin Lin MRN: [id number] Date of Birth: November 09, 1960 Referring Provider:     Cardiac Rehab from 03/28/2019 in [facility] Cardiac and Pulmonary Rehab  Referring Provider  Paraschos      Encounter Date: 04/15/2019  Check In: Session Check In - 04/15/19 1414      Check-In   Supervising physician immediately available to respond to emergencies  See telemetry face sheet for immediately available ER MD    Location  [facility]-Cardiac & Pulmonary Rehab    Staff Present  Renita Papa, RN Moises Blood, BS, ACSM CEP, Exercise Physiologist;Jeanna Durrell BS, Exercise Physiologist    Virtual Visit  No    Medication changes reported      No    Fall or balance concerns reported     No    Warm-up and Cool-down  Performed on first and last piece of equipment    Resistance Training Performed  Yes    VAD Patient?  No    PAD/SET Patient?  No      Pain Assessment   Currently in Pain?  No/denies          Social History   Tobacco Use  Smoking Status Former Smoker  . Packs/day: 0.25  . Years: 1.00  . Pack years: 0.25  . Types: Cigarettes  Smokeless Tobacco Never Used  Tobacco Comment   77-23 year old, hasn't smoked in 40 years    Goals Met:  Independence with exercise equipment Exercise tolerated well No report of cardiac concerns or symptoms Strength training completed today  Goals Unmet:  Not Applicable  Comments: Pt able to follow exercise prescription today without complaint.  Will continue to monitor for progression.    Dr. Emily Filbert is Medical Director for [place] and [facility].

## 2019-04-17 ENCOUNTER — Other Ambulatory Visit: Payer: Self-pay

## 2019-04-17 ENCOUNTER — Encounter: Payer: BC Managed Care – PPO | Admitting: *Deleted

## 2019-04-17 DIAGNOSIS — I2101 ST elevation (STEMI) myocardial infarction involving left main coronary artery: Secondary | ICD-10-CM

## 2019-04-17 DIAGNOSIS — I213 ST elevation (STEMI) myocardial infarction of unspecified site: Secondary | ICD-10-CM | POA: Diagnosis not present

## 2019-04-17 DIAGNOSIS — Z955 Presence of coronary angioplasty implant and graft: Secondary | ICD-10-CM

## 2019-04-17 NOTE — Progress Notes (Signed)
Daily Session Note  Patient Details  Name: Justin Lin MRN: [id number] Date of Birth: 10-Feb-1961 Referring Provider:     Cardiac Rehab from 03/28/2019 in [facility] Cardiac and Pulmonary Rehab  Referring Provider  Paraschos      Encounter Date: 04/17/2019  Check In: Session Check In - 04/17/19 1413      Check-In   Supervising physician immediately available to respond to emergencies  See telemetry face sheet for immediately available ER MD    Location  [facility]-Cardiac & Pulmonary Rehab    Staff Present  Renita Papa, RN BSN;Joseph Hood RCP,RRT,BSRT;Melissa Londonderry RDN, LDN    Virtual Visit  No    Medication changes reported      No    Fall or balance concerns reported     No    Warm-up and Cool-down  Performed on first and last piece of equipment    Resistance Training Performed  Yes    VAD Patient?  No    PAD/SET Patient?  No      Pain Assessment   Currently in Pain?  No/denies          Social History   Tobacco Use  Smoking Status Former Smoker  . Packs/day: 0.25  . Years: 1.00  . Pack years: 0.25  . Types: Cigarettes  Smokeless Tobacco Never Used  Tobacco Comment   61-55 year old, hasn't smoked in 40 years    Goals Met:  Independence with exercise equipment Exercise tolerated well No report of cardiac concerns or symptoms Strength training completed today  Goals Unmet:  Not Applicable  Comments: Pt able to follow exercise prescription today without complaint.  Will continue to monitor for progression.    Dr. Emily Filbert is Medical Director for [place] and [facility].

## 2019-04-18 ENCOUNTER — Encounter: Payer: BC Managed Care – PPO | Admitting: *Deleted

## 2019-04-18 DIAGNOSIS — Z955 Presence of coronary angioplasty implant and graft: Secondary | ICD-10-CM

## 2019-04-18 DIAGNOSIS — I213 ST elevation (STEMI) myocardial infarction of unspecified site: Secondary | ICD-10-CM | POA: Diagnosis not present

## 2019-04-18 DIAGNOSIS — I2101 ST elevation (STEMI) myocardial infarction involving left main coronary artery: Secondary | ICD-10-CM

## 2019-04-18 NOTE — Progress Notes (Signed)
Daily Session Note  Patient Details  Name: Justin Lin MRN: [id number] Date of Birth: December 20, 1960 Referring Provider:     Cardiac Rehab from 03/28/2019 in [facility] Cardiac and Pulmonary Rehab  Referring Provider  Paraschos      Encounter Date: 04/18/2019  Check In: Session Check In - 04/18/19 1408      Check-In   Supervising physician immediately available to respond to emergencies  See telemetry face sheet for immediately available ER MD    Location  [facility]Cardiac [facility] Pulmonary Re[facility]    Staff Present  Renita Papa, RN BSN;Jessica Luan Pulling, MA, RCEP, CCRP, CCET;Joseph [place] RCP,RRT,BSRT    Virtual Visit  No    Medication changes reported      No    Fall or balance concerns reported     No    Warm-up and Cool-down  Performed on first and last piece of equipment    Resistance Training Performed  Yes    VAD Patient?  No    PAD/SET Patient?  No      Pain Assessment   Currently in Pain?  No/denies          Social History   Tobacco Use  Smoking Status Former Smoker  . Packs/day: 0.25  . Years: 1.00  . Pack years: 0.25  . Types: Cigarettes  Smokeless Tobacco Never Used  Tobacco Comment   48-76 year old, hasn't smoked in 40 years    Goals Met:  Independence with exercise equipment Exercise tolerated well No report of cardiac concerns or symptoms Strength training completed today  Goals Unmet:  Not Applicable  Comments: Pt able to follow exercise prescription today without complaint.  Will continue to monitor for progression.  Reviewed home exercise with pt today.  Pt plans to walk and use treadmill and bike at home for exercise.  He also has weight equipment at home.  Reviewed THR, pulse, RPE, sign and symptoms, NTG use, and when to call 911 or MD.  Also discussed weather considerations and indoor options.  Pt voiced understanding.   Dr. Emily Filbert is Medical Director for [place] and [facility].

## 2019-04-22 ENCOUNTER — Other Ambulatory Visit: Payer: Self-pay

## 2019-04-22 ENCOUNTER — Encounter: Payer: BC Managed Care – PPO | Admitting: *Deleted

## 2019-04-22 DIAGNOSIS — I213 ST elevation (STEMI) myocardial infarction of unspecified site: Secondary | ICD-10-CM | POA: Diagnosis not present

## 2019-04-22 DIAGNOSIS — Z955 Presence of coronary angioplasty implant and graft: Secondary | ICD-10-CM

## 2019-04-22 DIAGNOSIS — I2101 ST elevation (STEMI) myocardial infarction involving left main coronary artery: Secondary | ICD-10-CM

## 2019-04-22 NOTE — Progress Notes (Signed)
Daily Session Note  Patient Details  Name: Justin Lin MRN: [id number] Date of Birth: [birth year] Referring Provider:     Cardiac Rehab from 03/28/2019 in [facility] Cardiac and Pulmonary Rehab  Referring Provider  Paraschos      Encounter Date: 04/22/2019  Check In: Session Check In - 04/22/19 1414      Check-In   Supervising physician immediately available to respond to emergencies  See telemetry face sheet for immediately available ER MD    Location  [facility]Cardiac [facility] Pulmonary Rehab    Staff Present  Renita Papa, RN Moises Blood, BS, ACSM CEP, Exercise Physiologist;Jessica [place], [place], RCEP, CCRP, CCET    Virtual Visit  No    Medication changes reported      No    Fall or balance concerns reported     No    Warm-up and Cool-down  Performed on first and last piece of equipment    Resistance Training Performed  Yes    VAD Patient?  No    PAD/SET Patient?  No      Pain Assessment   Currently in Pain?  No/denies          Social History   Tobacco Use  Smoking Status Former Smoker  . Packs/day: 0.25  . Years: 1.00  . Pack years: 0.25  . Types: Cigarettes  Smokeless Tobacco Never Used  Tobacco Comment   18-79 year old, hasn't smoked in 40 years    Goals Met:  Independence with exercise equipment Exercise tolerated well No report of cardiac concerns or symptoms Strength training completed today  Goals Unmet:  Not Applicable  Comments: Pt able to follow exercise prescription today without complaint.  Will continue to monitor for progression.    Dr. Emily Filbert is Medical Director for [place] and [facility].

## 2019-04-23 DIAGNOSIS — I2101 ST elevation (STEMI) myocardial infarction involving left main coronary artery: Secondary | ICD-10-CM

## 2019-04-23 NOTE — Progress Notes (Signed)
Completed Initial RD eval 

## 2019-04-24 ENCOUNTER — Encounter: Payer: BC Managed Care – PPO | Admitting: *Deleted

## 2019-04-24 ENCOUNTER — Other Ambulatory Visit: Payer: Self-pay

## 2019-04-24 DIAGNOSIS — I2101 ST elevation (STEMI) myocardial infarction involving left main coronary artery: Secondary | ICD-10-CM

## 2019-04-24 DIAGNOSIS — I213 ST elevation (STEMI) myocardial infarction of unspecified site: Secondary | ICD-10-CM | POA: Diagnosis not present

## 2019-04-24 DIAGNOSIS — Z955 Presence of coronary angioplasty implant and graft: Secondary | ICD-10-CM

## 2019-04-24 NOTE — Progress Notes (Signed)
Daily Session Note  Patient Details  Name: Justin Lin MRN: [id number] Date of Birth: 12-13-1960 Referring Provider:     Cardiac Rehab from 03/28/2019 in [facility] Cardiac and Pulmonary Rehab  Referring Provider  Paraschos      Encounter Date: 04/24/2019  Check In: Session Check In - 04/24/19 1410      Check-In   Supervising physician immediately available to respond to emergencies  See telemetry face sheet for immediately available ER MD    Location  [facility] Pul[facility] Re[facility]    Staff Present  Renita Papa, RN Vickki Hearing, BA, ACSM CEP, Exercise Physiologist;Melissa Caiola RDN, LDN    Virtual Visit  No    Medication changes reported      No    Warm-up and Cool-down  Performed on first and last piece of equipment    Resistance Training Performed  Yes    VAD Patient?  No    PAD/SET Patient?  No      Pain Assessment   Currently in Pain?  No/denies          Social History   Tobacco Use  Smoking Status Former Smoker  . Packs/day: 0.25  . Years: 1.00  . Pack years: 0.25  . Types: Cigarettes  Smokeless Tobacco Never Used  Tobacco Comment   56-73 year old, hasn't smoked in 40 years    Goals Met:  Independence with exercise equipment Exercise tolerated well No report of cardiac concerns or symptoms Strength training completed today  Goals Unmet:  Not Applicable  Comments: Pt able to follow exercise prescription today without complaint.  Will continue to monitor for progression.    Dr. Emily Filbert is Medical Director for [place] and [facility].

## 2019-04-25 ENCOUNTER — Encounter: Payer: BC Managed Care – PPO | Admitting: *Deleted

## 2019-04-25 DIAGNOSIS — I213 ST elevation (STEMI) myocardial infarction of unspecified site: Secondary | ICD-10-CM | POA: Diagnosis not present

## 2019-04-25 DIAGNOSIS — I2101 ST elevation (STEMI) myocardial infarction involving left main coronary artery: Secondary | ICD-10-CM

## 2019-04-25 DIAGNOSIS — Z955 Presence of coronary angioplasty implant and graft: Secondary | ICD-10-CM

## 2019-04-25 NOTE — Progress Notes (Signed)
Daily Session Note  Patient Details  Name: Justin Lin MRN: [id number] Date of Birth: August 31, 1960 Referring Provider:     Cardiac Rehab from 03/28/2019 in [facility] Cardiac and Pulmonary Rehab  Referring Provider  Paraschos      Encounter Date: 04/25/2019  Check In: Session Check In - 04/25/19 1409      Check-In   Supervising physician immediately available to respond to emergencies  See telemetry face sheet for immediately available ER MD    Location  [facility]Cardiac [facility] Pulmonary Rehab    Staff Present  Renita Papa, RN BSN;Jeanna Durrell BS, Exercise Physiologist;Susanne Bice, RN, BSN, CCRP    Virtual Visit  No    Medication changes reported      No    Fall or balance concerns reported     No    Warm-up and Cool-down  Performed on first and last piece of equipment    Resistance Training Performed  Yes    VAD Patient?  No    PAD/SET Patient?  No      Pain Assessment   Currently in Pain?  No/denies          Social History   Tobacco Use  Smoking Status Former Smoker  . Packs/day: 0.25  . Years: 1.00  . Pack years: 0.25  . Types: Cigarettes  Smokeless Tobacco Never Used  Tobacco Comment   59-35 year old, hasn't smoked in 40 years    Goals Met:  Independence with exercise equipment Exercise tolerated well No report of cardiac concerns or symptoms Strength training completed today  Goals Unmet:  Not Applicable  Comments: Pt able to follow exercise prescription today without complaint.  Will continue to monitor for progression.    Dr. Emily Filbert is Medical Director for [place] and [facility].

## 2019-04-29 ENCOUNTER — Encounter: Payer: BC Managed Care – PPO | Admitting: *Deleted

## 2019-04-29 ENCOUNTER — Other Ambulatory Visit: Payer: Self-pay

## 2019-04-29 DIAGNOSIS — Z955 Presence of coronary angioplasty implant and graft: Secondary | ICD-10-CM

## 2019-04-29 DIAGNOSIS — I2101 ST elevation (STEMI) myocardial infarction involving left main coronary artery: Secondary | ICD-10-CM

## 2019-04-29 DIAGNOSIS — I213 ST elevation (STEMI) myocardial infarction of unspecified site: Secondary | ICD-10-CM | POA: Diagnosis not present

## 2019-04-29 NOTE — Progress Notes (Signed)
Daily Session Note  Patient Details  Name: Justin Lin MRN: [id number] Date of Birth: Oct 23, 1960 Referring Provider:     Cardiac Rehab from 03/28/2019 in [facility] Cardiac and Pulmonary Rehab  Referring Provider  Paraschos      Encounter Date: 04/29/2019  Check In: Session Check In - 04/29/19 1407      Check-In   Supervising physician immediately available to respond to emergencies  See telemetry face sheet for immediately available ER MD    Location  [facility]Cardiac [facility] Pulmonary Rehab    Staff Present  Renita Papa, RN Moises Blood, BS, ACSM CEP, Exercise Physiologist;Jeanna Durrell BS, Exercise Physiologist    Virtual Visit  No    Medication changes reported      No    Fall or balance concerns reported     No    Warm-up and Cool-down  Performed on first and last piece of equipment    Resistance Training Performed  Yes    VAD Patient?  No    PAD/SET Patient?  No      Pain Assessment   Currently in Pain?  No/denies          Social History   Tobacco Use  Smoking Status Former Smoker  . Packs/day: 0.25  . Years: 1.00  . Pack years: 0.25  . Types: Cigarettes  Smokeless Tobacco Never Used  Tobacco Comment   52-50 year old, hasn't smoked in 40 years    Goals Met:  Independence with exercise equipment Exercise tolerated well No report of cardiac concerns or symptoms Strength training completed today  Goals Unmet:  Not Applicable  Comments: Pt able to follow exercise prescription today without complaint.  Will continue to monitor for progression.    Dr. Emily Filbert is Medical Director for [place] and [facility].

## 2019-05-01 ENCOUNTER — Other Ambulatory Visit: Payer: Self-pay

## 2019-05-01 ENCOUNTER — Encounter: Payer: BC Managed Care – PPO | Admitting: *Deleted

## 2019-05-01 DIAGNOSIS — Z955 Presence of coronary angioplasty implant and graft: Secondary | ICD-10-CM

## 2019-05-01 DIAGNOSIS — I2101 ST elevation (STEMI) myocardial infarction involving left main coronary artery: Secondary | ICD-10-CM

## 2019-05-01 DIAGNOSIS — I213 ST elevation (STEMI) myocardial infarction of unspecified site: Secondary | ICD-10-CM | POA: Diagnosis not present

## 2019-05-01 NOTE — Progress Notes (Signed)
Daily Session Note  Patient Details  Name: Justin Lin MRN: [id number] Date of Birth: 05/31/1961 Referring Provider:     Cardiac Rehab from 03/28/2019 in [facility] Cardiac and Pulmonary Rehab  Referring Provider  Paraschos      Encounter Date: 05/01/2019  Check In: Session Check In - 05/01/19 1416      Check-In   Supervising physician immediately available to respond to emergencies  See telemetry face sheet for immediately available ER MD    Location  [facility]Cardiac [facility] Pulmonary Rehab    Staff Present  Heath Lark, RN, BSN, CCRP;Melissa Caiola RDN, LDN;Meredith Sherryll Burger, RN BSN;Amanda Sommer, BA, ACSM CEP, Exercise Physiologist    Virtual Visit  No    Medication changes reported      No    Fall or balance concerns reported     No    Warm-up and Cool-down  Performed on first and last piece of equipment    Resistance Training Performed  Yes    VAD Patient?  No    PAD/SET Patient?  No      Pain Assessment   Currently in Pain?  No/denies          Social History   Tobacco Use  Smoking Status Former Smoker  . Packs/day: 0.25  . Years: 1.00  . Pack years: 0.25  . Types: Cigarettes  Smokeless Tobacco Never Used  Tobacco Comment   57-12 year old, hasn't smoked in 40 years    Goals Met:  Independence with exercise equipment Exercise tolerated well No report of cardiac concerns or symptoms  Goals Unmet:  Not Applicable  Comments: Pt able to follow exercise prescription today without complaint.  Will continue to monitor for progression.    Dr. Emily Filbert is Medical Director for [place] and [facility].

## 2019-05-06 ENCOUNTER — Other Ambulatory Visit: Payer: Self-pay

## 2019-05-06 ENCOUNTER — Encounter: Payer: BC Managed Care – PPO | Admitting: *Deleted

## 2019-05-06 DIAGNOSIS — Z955 Presence of coronary angioplasty implant and graft: Secondary | ICD-10-CM

## 2019-05-06 DIAGNOSIS — I213 ST elevation (STEMI) myocardial infarction of unspecified site: Secondary | ICD-10-CM | POA: Diagnosis not present

## 2019-05-06 DIAGNOSIS — I2101 ST elevation (STEMI) myocardial infarction involving left main coronary artery: Secondary | ICD-10-CM

## 2019-05-06 NOTE — Progress Notes (Signed)
Daily Session Note  Patient Details  Name: Justin Lin MRN: [id number] Date of Birth: 1960-12-26 Referring Provider:     Cardiac Rehab from 03/28/2019 in [facility] Cardiac and Pulmonary Rehab  Referring Provider  Paraschos      Encounter Date: 05/06/2019  Check In: Session Check In - 05/06/19 1411      Check-In   Supervising physician immediately available to respond to emergencies  See telemetry face sheet for immediately available ER MD    Location  [facility]Cardiac [facility] Pulmonary Rehab    Staff Present  Renita Papa, RN BSN;Jessica [place], MA, RCEP, CCRP, [place], BS, ACSM CEP, Exercise Physiologist;Joseph Tessie Fass RCP,RRT,BSRT    Virtual Visit  No    Medication changes reported      No    Warm-up and Cool-down  Performed on first and last piece of equipment    Resistance Training Performed  Yes    VAD Patient?  No    PAD/SET Patient?  No      Pain Assessment   Currently in Pain?  No/denies          Social History   Tobacco Use  Smoking Status Former Smoker  . Packs/day: 0.25  . Years: 1.00  . Pack years: 0.25  . Types: Cigarettes  Smokeless Tobacco Never Used  Tobacco Comment   63-47 year old, hasn't smoked in 40 years    Goals Met:  Independence with exercise equipment Exercise tolerated well No report of cardiac concerns or symptoms Strength training completed today  Goals Unmet:  Not Applicable  Comments: Pt able to follow exercise prescription today without complaint.  Will continue to monitor for progression.    Dr. Emily Filbert is Medical Director for [place] and [facility].

## 2019-05-08 ENCOUNTER — Encounter: Payer: Self-pay | Admitting: *Deleted

## 2019-05-08 ENCOUNTER — Encounter: Payer: BC Managed Care – PPO | Attending: Cardiology | Admitting: *Deleted

## 2019-05-08 ENCOUNTER — Other Ambulatory Visit: Payer: Self-pay

## 2019-05-08 DIAGNOSIS — Z955 Presence of coronary angioplasty implant and graft: Secondary | ICD-10-CM | POA: Diagnosis not present

## 2019-05-08 DIAGNOSIS — I2101 ST elevation (STEMI) myocardial infarction involving left main coronary artery: Secondary | ICD-10-CM

## 2019-05-08 DIAGNOSIS — I213 ST elevation (STEMI) myocardial infarction of unspecified site: Secondary | ICD-10-CM | POA: Diagnosis not present

## 2019-05-08 NOTE — Progress Notes (Signed)
Daily Session Note  Patient Details  Name: Justin Lin MRN: [id number] Date of Birth: 1961/02/12 Referring Provider:     Cardiac Rehab from 03/28/2019 in [facility] Cardiac and Pulmonary Rehab  Referring Provider  Paraschos      Encounter Date: 05/08/2019  Check In: Session Check In - 05/08/19 1409      Check-In   Supervising physician immediately available to respond to emergencies  See telemetry face sheet for immediately available ER MD    Location  [facility]Cardiac [facility] Pulmonary Rehab    Staff Present  Renita Papa, RN Vickki Hearing, BA, ACSM CEP, Exercise Physiologist;Melissa Caiola RDN, LDN;Joseph Hood RCP,RRT,BSRT    Virtual Visit  No    Medication changes reported      No    Fall or balance concerns reported     No    Warm-up and Cool-down  Performed on first and last piece of equipment    Resistance Training Performed  Yes    VAD Patient?  No    PAD/SET Patient?  No      Pain Assessment   Currently in Pain?  No/denies          Social History   Tobacco Use  Smoking Status Former Smoker  . Packs/day: 0.25  . Years: 1.00  . Pack years: 0.25  . Types: Cigarettes  Smokeless Tobacco Never Used  Tobacco Comment   55-35 year old, hasn't smoked in 40 years    Goals Met:  Independence with exercise equipment Exercise tolerated well No report of cardiac concerns or symptoms Strength training completed today  Goals Unmet:  Not Applicable  Comments: Pt able to follow exercise prescription today without complaint.  Will continue to monitor for progression.    Dr. Emily Filbert is Medical Director for [place] and [facility].

## 2019-05-08 NOTE — Progress Notes (Signed)
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  acetaminophen (TYLENOL) 325 MG tablet, Take 2 tablets (650 mg total) by mouth every 4 (four) hours as needed for headache or mild pain., Disp: , Rfl:  .  amiodarone (PACERONE) 200 MG tablet, Take 2 tablets (400 mg total) by mouth 2 (two) times daily. Take 400 mg twice a day for 1 week followed by 200 mg twice a day, Disp: 90 tablet, Rfl: 0 .  aspirin EC 81 MG tablet, Take 1 tablet (81 mg total) by mouth daily., Disp: 150 tablet, Rfl: 2 .  atorvastatin (LIPITOR) 80 MG tablet, Take 1 tablet (80 mg total) by mouth daily at 6 PM., Disp: 30 tablet, Rfl: 0 .  lisinopril (ZESTRIL) 2.5 MG tablet, Take 1 tablet (2.5 mg total) by mouth daily., Disp: 30 tablet, Rfl: 0 .  loratadine (CLARITIN) 10 MG tablet, Take 10 mg by mouth daily as needed for allergies., Disp: , Rfl:  .  metoprolol succinate (TOPROL-XL) 50 MG 24 hr tablet, Take 1 tablet (50 mg total) by mouth daily. Take with or immediately following a meal., Disp: 30 tablet, Rfl: 0 .  nitroGLYCERIN (NITROSTAT) 0.4 MG SL tablet, Place 1 tablet (0.4 mg total) under the tongue every 5 (five) minutes x 3 doses as needed for chest pain., Disp: 10 tablet, Rfl: 0 .  ticagrelor (BRILINTA) 90 MG TABS tablet, Take 1 tablet (90 mg total) by mouth 2 (two) times daily., Disp: 60 tablet, Rfl: 0  Past Medical History: Past Medical History:  Diagnosis Date  . Kidney stone     Tobacco  Use: Social History   Tobacco Use  Smoking Status Former Smoker  . Packs/day: 0.25  . Years: 1.00  . Pack years: 0.25  . Types: Cigarettes  Smokeless Tobacco Never Used  Tobacco Comment   1-75 year old, hasn't smoked in 40 years    Labs: Recent Review Flowsheet Data    Labs for ITP Cardiac and Pulmonary Rehab Latest Ref Rng & Units 03/21/2019 03/22/2019   Cholestrol 0 - 200 mg/dL 206(H) 195   LDLCALC 0 - 99 mg/dL 145(H) 143(H)   HDL >40 mg/dL 37(L) 31(L)   Trlycerides <150 mg/dL 119 103       Exercise Target Goals: Exercise Program Goal: Individual exercise prescription set using results from initial 6 min walk test and THRR while considering  patient's activity barriers and safety.   Exercise Prescription Goal: Initial exercise prescription builds to 30-45 minutes a day of aerobic activity, 2-3 days per week.  Home exercise guidelines will be given to patient during program as part of exercise prescription that the participant will acknowledge.  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 HR  112 bpm     Max Ex. BP  120/90     2 Minute Post BP  118/82        Oxygen Initial Assessment:   Oxygen Re-Evaluation:   Oxygen Discharge (Final Oxygen Re-Evaluation):   Initial Exercise Prescription: Initial Exercise Prescription - 03/28/19 1500      Date of Initial Exercise RX and Referring Provider    Date  03/28/19    Referring Provider  Paraschos      Treadmill   MPH  2.5    Grade  1    Minutes  15    METs  2.7      Recumbant Bike   Level  2    RPM  80    Watts  40    Minutes  15    METs  3      NuStep   Level  3    SPM  80    Minutes  15    METs  3      Elliptical   Level  1    Speed  3.5    Minutes  15    METs  3      REL-XR   Level  2    Watts  40    Speed  50    Minutes  15    METs  3      Prescription Details   Duration  Progress to 30 minutes of continuous aerobic without signs/symptoms of physical distress      Intensity   THRR 40-80% of Max Heartrate  113-145    Ratings of Perceived Exertion  11-15    Perceived Dyspnea  0-4      Progression   Progression  Continue progressive overload as per policy without signs/symptoms or physical distress.      Resistance Training   Training Prescription  Yes    Weight  3    Reps  10-15       Perform Capillary Blood Glucose checks as needed.  Exercise Prescription Changes: Exercise Prescription Changes    Row Name 04/17/19 1300 04/18/19 1400 04/29/19 1300         Response to Exercise   Blood Pressure (Admit)  130/80  -  122/80     Blood Pressure (Exercise)  140/70  -  138/100     Blood Pressure (Exit)  110/70  -  122/74     Heart Rate (Admit)  75 bpm  -  73 bpm     Heart Rate (Exercise)  119 bpm  -  112 bpm     Heart Rate (Exit)  76 bpm  -  75 bpm     Rating of Perceived Exertion (Exercise)  14  -  15     Symptoms  none  -  none     Duration  Continue with 30 min of aerobic exercise without signs/symptoms of physical distress.  -  Continue with 30 min of aerobic exercise without signs/symptoms of physical distress.     Intensity  THRR unchanged  -  THRR unchanged       Progression   Progression  Continue to progress workloads to maintain intensity without signs/symptoms of physical distress.  -  Continue to progress workloads to maintain intensity without signs/symptoms of physical distress.      Average METs  3.87  -  6.34       Resistance Training   Training Prescription  Yes  -  Yes     Weight  3 lb  -  3 lb     Reps  10-15  -  10-15       Interval Training   Interval Training  No  -  Yes     Equipment  -  -  Recumbant Bike;REL-XR     Comments  -  -  30 sec on and 1 min off       Treadmill   MPH  2.5  -  2.9     Grade  3.5  -  4     Minutes  15  -  15     METs  4.12  -  4.82       Recumbant Bike   Level  5  -  5     Watts  -  -  117     Minutes  15  -  15     METs  -  -  4.9       Elliptical   Level  1  -  12     Speed  4.4  -  4.6     Minutes  15  -  15       REL-XR   Level  7  -  7     Minutes  15  -  15     METs  3.2  -  9.4       T5 Nustep   Level  2  -  -     Minutes  15  -  -     METs  4.3  -  -       Home Exercise Plan   Plans to continue exercise at  -  Home (comment) walking, treadmill, recumbent bike  Home (comment) walking, treadmill, recumbent bike     Frequency  -  Add 2 additional days to program exercise sessions.  Add 2 additional days to program exercise sessions.     Initial Home Exercises Provided  -  04/18/19  04/18/19        Exercise Comments:   Exercise Goals and Review: Exercise Goals    Row Name 03/28/19 1411             Exercise Goals   Increase Physical Activity  Yes       Intervention  Provide advice, education, support and counseling about physical activity/exercise needs.;Develop an individualized exercise prescription for aerobic and resistive training based on initial evaluation findings, risk stratification, comorbidities and participant's personal goals.       Expected Outcomes  Short Term: Attend rehab on a regular basis to increase amount of physical activity.;Long Term: Add in home exercise to make exercise part of routine and to increase amount of physical activity.;Long Term: Exercising regularly  at least 3-5 days a week.       Increase Strength and Stamina  Yes       Intervention  Provide advice,  education, support and counseling about physical activity/exercise needs.;Develop an individualized exercise prescription for aerobic and resistive training based on initial evaluation findings, risk stratification, comorbidities and participant's personal goals.       Expected Outcomes  Short Term: Increase workloads from initial exercise prescription for resistance, speed, and METs.;Short Term: Perform resistance training exercises routinely during rehab and add in resistance training at home;Long Term: Improve cardiorespiratory fitness, muscular endurance and strength as measured by increased METs and functional capacity (6MWT)       Able to understand and use rate of perceived exertion (RPE) scale  Yes       Intervention  Provide education and explanation on how to use RPE scale       Expected Outcomes  Short Term: Able to use RPE daily in rehab to express subjective intensity level;Long Term:  Able to use RPE to guide intensity level when exercising independently       Able to understand and use Dyspnea scale  Yes       Intervention  Provide education and explanation on how to use Dyspnea scale       Expected Outcomes  Short Term: Able to use Dyspnea scale daily in rehab to express subjective sense of shortness of breath during exertion;Long Term: Able to use Dyspnea scale to guide intensity level when exercising independently       Knowledge and understanding of Target Heart Rate Range (THRR)  Yes       Intervention  Provide education and explanation of THRR including how the numbers were predicted and where they are located for reference       Expected Outcomes  Short Term: Able to state/look up THRR;Short Term: Able to use daily as guideline for intensity in rehab;Long Term: Able to use THRR to govern intensity when exercising independently       Able to check pulse independently  Yes       Intervention  Provide education and demonstration on how to check pulse in carotid and radial  arteries.;Review the importance of being able to check your own pulse for safety during independent exercise       Expected Outcomes  Short Term: Able to explain why pulse checking is important during independent exercise;Long Term: Able to check pulse independently and accurately       Understanding of Exercise Prescription  Yes       Intervention  Provide education, explanation, and written materials on patient's individual exercise prescription       Expected Outcomes  Short Term: Able to explain program exercise prescription;Long Term: Able to explain home exercise prescription to exercise independently          Exercise Goals Re-Evaluation : Exercise Goals Re-Evaluation    Row Name 04/01/19 1418 04/17/19 1339 04/18/19 1442 04/29/19 1341       Exercise Goal Re-Evaluation   Exercise Goals Review  Able to understand and use rate of perceived exertion (RPE) scale;Increase Physical Activity;Knowledge and understanding of Target Heart Rate Range (THRR);Understanding of Exercise Prescription;Able to check pulse independently;Increase Strength and Stamina  Increase Physical Activity;Increase Strength and Stamina;Understanding of Exercise Prescription  Increase Physical Activity;Increase Strength and Stamina;Understanding of Exercise Prescription  Increase Physical Activity;Increase Strength and Stamina;Understanding of Exercise Prescription    Comments  Reviewed RPE scale, THR and program prescription with pt  today.  Pt voiced understanding and was given a copy of goals to take home.  Justin Lin is off to a great start in rehab.  He doing well on the elliptical and up to level 7 on XR.  We will continue to monitor his progress.  Justin Lin is doing well. He is feeling stronger and started intervals today. Reviewed home exercise with pt today.  Pt plans to walk and use treadmill and bike at home for exercise.  He also has weight equipment at home.  Reviewed THR, pulse, RPE, sign and symptoms, NTG use, and when to  call 911 or MD.  Also discussed weather considerations and indoor options.  Pt voiced understanding.  Justin Lin is doing well in rehab.  He is up to level 117 watts on the recumbent bike.  We will continue to monitor his progress.    Expected Outcomes  Short: Use RPE daily to regulate intensity. Long: Follow program prescription in THR.  Short: Review home exercise guidelines.  Long: Continue to improve stamina.  Short: Start add exercise in at home.  Long: Continue to improve stamina.  Short: Continue to use intervals.  Long: Continue to exercise more at home.       Discharge Exercise Prescription (Final Exercise Prescription Changes): Exercise Prescription Changes - 04/29/19 1300      Response to Exercise   Blood Pressure (Admit)  122/80    Blood Pressure (Exercise)  138/100    Blood Pressure (Exit)  122/74    Heart Rate (Admit)  73 bpm    Heart Rate (Exercise)  112 bpm    Heart Rate (Exit)  75 bpm    Rating of Perceived Exertion (Exercise)  15    Symptoms  none    Duration  Continue with 30 min of aerobic exercise without signs/symptoms of physical distress.    Intensity  THRR unchanged      Progression   Progression  Continue to progress workloads to maintain intensity without signs/symptoms of physical distress.    Average METs  6.34      Resistance Training   Training Prescription  Yes    Weight  3 lb    Reps  10-15      Interval Training   Interval Training  Yes    Equipment  Recumbant Bike;REL-XR    Comments  30 sec on and 1 min off      Treadmill   MPH  2.9    Grade  4    Minutes  15    METs  4.82      Recumbant Bike   Level  5    Watts  117    Minutes  15    METs  4.9      Elliptical   Level  12    Speed  4.6    Minutes  15      REL-XR   Level  7    Minutes  15    METs  9.4      Home Exercise Plan   Plans to continue exercise at  Home (comment)   walking, treadmill, recumbent bike   Frequency  Add 2 additional days to program exercise sessions.     Initial Home Exercises Provided  04/18/19       Nutrition:  Target Goals: Understanding of nutrition guidelines, daily intake of sodium <1575m, cholesterol <2044m calories 30% from fat and 7% or less from saturated fats, daily to have 5 or more servings of fruits  and vegetables.  Biometrics:    Nutrition Therapy Plan and Nutrition Goals: Nutrition Therapy & Goals - 04/23/19 0900      Nutrition Therapy   Diet  HH, low Na    Protein (specify units)  110g    Fiber  30 grams    Whole Grain Foods  3 servings    Saturated Fats  12 max. grams    Fruits and Vegetables  5 servings/day    Sodium  1.5 grams      Personal Nutrition Goals   Nutrition Goal  ST: continue HH eating LT: Lose weight (goal wt), eat healthy and be healthy    Comments  B: oatmeal w/ banana L: grilled chicken in a salad, tilapia with some vegetables D: chicken 3-4x/week, trying to limit red meat, eat more salads, more fruits (pineapples and strawberries), limiting sweets (when he does eat sweets he makes them). Snacks mixed unsalted peanuts. Discussed HH eating. Pt reports honoring hunger and feeling good. Pt made many diet changes since leaving the hospital, will continue to monitor progress.      Intervention Plan   Intervention  Prescribe, educate and counsel regarding individualized specific dietary modifications aiming towards targeted core components such as weight, hypertension, lipid management, diabetes, heart failure and other comorbidities.;Nutrition handout(s) given to patient.    Expected Outcomes  Short Term Goal: Understand basic principles of dietary content, such as calories, fat, sodium, cholesterol and nutrients.;Short Term Goal: A plan has been developed with personal nutrition goals set during dietitian appointment.;Long Term Goal: Adherence to prescribed nutrition plan.       Nutrition Assessments:   Nutrition Goals Re-Evaluation:   Nutrition Goals Discharge (Final Nutrition Goals  Re-Evaluation):   Psychosocial: Target Goals: Acknowledge presence or absence of significant depression and/or stress, maximize coping skills, provide positive support system. Participant is able to verbalize types and ability to use techniques and skills needed for reducing stress and depression.   Initial Review & Psychosocial Screening: Initial Psych Review & Screening - 03/26/19 1012      Initial Review   Current issues with  Current Stress Concerns;Current Anxiety/Panic    Source of Stress Concerns  Family    Comments  Has a son 34 yo with down syndrome that lives with him.  He is a single parent and manages his care.  His son's mom does not support him in any way and that was why he retired early.      Family Dynamics   Good Support System?  Yes   Jacqlyn Larsen, girlfriend for 30 years, Lisabeth Devoid, his son's grandmother, and brother and sister that all live nearby   Comments  He has been a little more emotional with everything since getting home.      Barriers   Psychosocial barriers to participate in program  The patient should benefit from training in stress management and relaxation.;Psychosocial barriers identified (see note)      Screening Interventions   Interventions  To provide support and resources with identified psychosocial needs;Provide feedback about the scores to participant    Expected Outcomes  Short Term goal: Utilizing psychosocial counselor, staff and physician to assist with identification of specific Stressors or current issues interfering with healing process. Setting desired goal for each stressor or current issue identified.;Long Term Goal: Stressors or current issues are controlled or eliminated.;Short Term goal: Identification and review with participant of any Quality of Life or Depression concerns found by scoring the questionnaire.;Long Term goal: The participant improves quality of Life  and PHQ9 Scores as seen by post scores and/or verbalization of changes        Quality of Life Scores:  Quality of Life - 03/28/19 1412      Quality of Life   Select  Quality of Life      Quality of Life Scores   Health/Function Pre  20.1 %    Socioeconomic Pre  24 %    Psych/Spiritual Pre  24 %    Family Pre  27.6 %    GLOBAL Pre  22.86 %      Scores of 19 and below usually indicate a poorer quality of life in these areas.  A difference of  2-3 points is a clinically meaningful difference.  A difference of 2-3 points in the total score of the Quality of Life Index has been associated with significant improvement in overall quality of life, self-image, physical symptoms, and general health in studies assessing change in quality of life.  PHQ-9: Recent Review Flowsheet Data    Depression screen [facility] 2/9 03/28/2019   Decreased Interest 0   Down, Depressed, Hopeless 0   PHQ - 2 Score 0   Altered sleeping 3   Tired, decreased energy 1   Change in appetite 2   Feeling bad or failure about yourself  1   Trouble concentrating 0   Moving slowly or fidgety/restless 1   Suicidal thoughts 0   PHQ-9 Score 8   Difficult doing work/chores Somewhat difficult     Interpretation of Total Score  Total Score Depression Severity:  1-4 = Minimal depression, 5-9 = Mild depression, 10-14 = Moderate depression, 15-19 = Moderately severe depression, 20-27 = Severe depression   Psychosocial Evaluation and Intervention: Psychosocial Evaluation - 03/26/19 1042      Psychosocial Evaluation & Interventions   Interventions  Encouraged to exercise with the program and follow exercise prescription;Stress management education    Comments  Mr. Nippert has been more emotional than his normal since getting home from his MI.  He has mentioned that this is not his normal.  We talked about how it is normal to feel this way especially after a heart event and coming home from hospital.  He has a son with down syndrome that lives with him.  He is the primary caregiver for his son as his  son's mother no longer offers support finanacially or physically.  He retired early to better manage his son's care at home.  He also has a long time girlfriend that he says they have been together so long that they are practically married.  He has other family that lives near by in the county that offer some help.  His son's maternal grandmother helps him from time to time as well.  He noted that he does have some anxiety brought on by caring for his son, but currently is managing and not taking anything. Offered that is he feels that he may need something we can help facilitate that for him.    Expected Outcomes  Short: Asess QOL and PHQ9 on Thursday.  Long: Continue to follow up with pschosocial issues and get him to exercise for mental boost and self care.    Continue Psychosocial Services   Follow up required by staff       Psychosocial Re-Evaluation: Psychosocial Re-Evaluation    [place] Name 04/18/19 1447             Psychosocial Re-Evaluation   Current issues with  None Identified  Comments  Justin Lin says that he is doing "fantastic"!!  He is feeling good and has a new perspective on life. He says after his heart scare he is now seeing things differently.  Even to the point, that his girlfriend has noted that he does not complain like he used to.  He sleeps well with the help of medication.  His biggest stressor is ongoing COVID issues.       Expected Outcomes  Short: Continue to stay positive.  Long: Continue to have a good outlook on life.       Interventions  Encouraged to attend Cardiac Rehabilitation for the exercise       Continue Psychosocial Services   Follow up required by staff          Psychosocial Discharge (Final Psychosocial Re-Evaluation): Psychosocial Re-Evaluation - 04/18/19 1447      Psychosocial Re-Evaluation   Current issues with  None Identified    Comments  Justin Lin says that he is doing "fantastic"!!  He is feeling good and has a new perspective on life. He says  after his heart scare he is now seeing things differently.  Even to the point, that his girlfriend has noted that he does not complain like he used to.  He sleeps well with the help of medication.  His biggest stressor is ongoing COVID issues.    Expected Outcomes  Short: Continue to stay positive.  Long: Continue to have a good outlook on life.    Interventions  Encouraged to attend Cardiac Rehabilitation for the exercise    Continue Psychosocial Services   Follow up required by staff       Vocational Rehabilitation: Provide vocational rehab assistance to qualifying candidates.   Vocational Rehab Evaluation & Intervention: Vocational Rehab - 03/26/19 1011      Initial Vocational Rehab Evaluation & Intervention   Assessment shows need for Vocational Rehabilitation  No   currently retired      Education: Education Goals: Education classes will be provided on a variety of topics geared toward better understanding of heart health and risk factor modification. Participant will state understanding/return demonstration of topics presented as noted by education test scores.  Learning Barriers/Preferences: Learning Barriers/Preferences - 03/26/19 1012      Learning Barriers/Preferences   Learning Barriers  Sight   glasses   Learning Preferences  None       Education Topics:  AED/CPR: - Group verbal and written instruction with the use of models to demonstrate the basic use of the AED with the basic ABC's of resuscitation.   General Nutrition Guidelines/Fats and Fiber: -Group instruction provided by verbal, written material, models and posters to present the general guidelines for heart healthy nutrition. Gives an explanation and review of dietary fats and fiber.   Cardiac Rehab from 04/25/2019 in [facility] Cardiac and Pulmonary Rehab  Date  04/25/19  Educator  [facility]  Instruction Review Code  1- Verbalizes Understanding      Controlling Sodium/Reading Food Labels: -Group verbal and  written material supporting the discussion of sodium use in heart healthy nutrition. Review and explanation with models, verbal and written materials for utilization of the food label.   Exercise Physiology & General Exercise Guidelines: - Group verbal and written instruction with models to review the exercise physiology of the cardiovascular system and associated critical values. Provides general exercise guidelines with specific guidelines to those with heart or lung disease.    Aerobic Exercise & Resistance Training: - Gives group verbal and written  instruction on the various components of exercise. Focuses on aerobic and resistive training programs and the benefits of this training and how to safely progress through these programs..   Cardiac Rehab from 04/25/2019 in [facility] Cardiac and Pulmonary Rehab  Date  04/11/19  Educator  [facility]  Instruction Review Code  1- Verbalizes Understanding      Flexibility, Balance, Mind/Body Relaxation: Provides group verbal/written instruction on the benefits of flexibility and balance training, including mind/body exercise modes such as yoga, pilates and tai chi.  Demonstration and skill practice provided.   Cardiac Rehab from 04/25/2019 in [facility] Cardiac and Pulmonary Rehab  Date  04/25/19  Educator  [place]  Instruction Review Code  1- Verbalizes Understanding      Stress and Anxiety: - Provides group verbal and written instruction about the health risks of elevated stress and causes of high stress.  Discuss the correlation between heart/lung disease and anxiety and treatment options. Review healthy ways to manage with stress and anxiety.   Depression: - Provides group verbal and written instruction on the correlation between heart/lung disease and depressed mood, treatment options, and the stigmas associated with seeking treatment.   Anatomy & Physiology of the Heart: - Group verbal and written instruction and models provide basic cardiac anatomy and  physiology, with the coronary electrical and arterial systems. Review of Valvular disease and Heart Failure   Cardiac Rehab from 04/25/2019 in [facility] Cardiac and Pulmonary Rehab  Date  04/11/19  Educator  [facility]  Instruction Review Code  1- Verbalizes Understanding      Cardiac Procedures: - Group verbal and written instruction to review commonly prescribed medications for heart disease. Reviews the medication, class of the drug, and side effects. Includes the steps to properly store meds and maintain the prescription regimen. (beta blockers and nitrates)   Cardiac Rehab from 04/25/2019 in [facility] Cardiac and Pulmonary Rehab  Date  04/11/19  Educator  [facility]  Instruction Review Code  1- Verbalizes Understanding      Cardiac Medications I: - Group verbal and written instruction to review commonly prescribed medications for heart disease. Reviews the medication, class of the drug, and side effects. Includes the steps to properly store meds and maintain the prescription regimen.   Cardiac Medications II: -Group verbal and written instruction to review commonly prescribed medications for heart disease. Reviews the medication, class of the drug, and side effects. (all other drug classes)    Go Sex-Intimacy & Heart Disease, Get SMART - Goal Setting: - Group verbal and written instruction through game format to discuss heart disease and the return to sexual intimacy. Provides group verbal and written material to discuss and apply goal setting through the application of the S.M.A.R.T. Method.   Cardiac Rehab from 04/25/2019 in [facility] Cardiac and Pulmonary Rehab  Date  04/11/19  Educator  [facility]  Instruction Review Code  1- Verbalizes Understanding      Other Matters of the Heart: - Provides group verbal, written materials and models to describe Stable Angina and Peripheral Artery. Includes description of the disease process and treatment options available to the cardiac patient.   Exercise & Equipment  Safety: - Individual verbal instruction and demonstration of equipment use and safety with use of the equipment.   Cardiac Rehab from 03/28/2019 in [facility] Cardiac and Pulmonary Rehab  Date  03/28/19  Educator  [place]  Instruction Review Code  1- Verbalizes Understanding      Infection Prevention: - Provides verbal and written material to individual with discussion  of infection control including proper hand washing and proper equipment cleaning during exercise session.   Cardiac Rehab from 03/28/2019 in [facility] Cardiac and Pulmonary Rehab  Date  03/28/19  Educator  [place]  Instruction Review Code  1- Verbalizes Understanding      Falls Prevention: - Provides verbal and written material to individual with discussion of falls prevention and safety.   Cardiac Rehab from 03/28/2019 in [facility] Cardiac and Pulmonary Rehab  Date  03/28/19  Educator  [place]  Instruction Review Code  1- Verbalizes Understanding      Diabetes: - Individual verbal and written instruction to review signs/symptoms of diabetes, desired ranges of glucose level fasting, after meals and with exercise. Acknowledge that pre and post exercise glucose checks will be done for 3 sessions at entry of program.   Know Your Numbers and Risk Factors: -Group verbal and written instruction about important numbers in your health.  Discussion of what are risk factors and how they play a role in the disease process.  Review of Cholesterol, Blood Pressure, Diabetes, and BMI and the role they play in your overall health.   Sleep Hygiene: -Provides group verbal and written instruction about how sleep can affect your health.  Define sleep hygiene, discuss sleep cycles and impact of sleep habits. Review good sleep hygiene tips.    Other: -Provides group and verbal instruction on various topics (see comments)   Knowledge Questionnaire Score: Knowledge Questionnaire Score - 03/28/19 1413      Knowledge Questionnaire Score   Pre Score  24/26  exercise and nutrition       Core Components/Risk Factors/Patient Goals at Admission: Personal Goals and Risk Factors at Admission - 03/26/19 1048      Core Components/Risk Factors/Patient Goals on Admission    Weight Management  Yes;Obesity;Weight Loss    Intervention  Weight Management: Develop a combined nutrition and exercise program designed to reach desired caloric intake, while maintaining appropriate intake of nutrient and fiber, sodium and fats, and appropriate energy expenditure required for the weight goal.;Weight Management: Provide education and appropriate resources to help participant work on and attain dietary goals.;Weight Management/Obesity: Establish reasonable short term and long term weight goals.;Obesity: Provide education and appropriate resources to help participant work on and attain dietary goals.    Expected Outcomes  Short Term: Continue to assess and modify interventions until short term weight is achieved;Long Term: Adherence to nutrition and physical activity/exercise program aimed toward attainment of established weight goal;Weight Loss: Understanding of general recommendations for a balanced deficit meal plan, which promotes 1-2 lb weight loss per week and includes a negative energy balance of [id number] kcal/d;Understanding of distribution of calorie intake throughout the day with the consumption of 4-5 meals/snacks;Understanding recommendations for meals to include 15-35% energy as protein, 25-35% energy from fat, 35-60% energy from carbohydrates, less than 296m of dietary cholesterol, 20-35 gm of total fiber daily    Hypertension  Yes    Intervention  Provide education on lifestyle modifcations including regular physical activity/exercise, weight management, moderate sodium restriction and increased consumption of fresh fruit, vegetables, and low fat dairy, alcohol moderation, and smoking cessation.;Monitor prescription use compliance.    Expected Outcomes  Short Term:  Continued assessment and intervention until BP is < 140/946mHG in hypertensive participants. < 130/8024mG in hypertensive participants with diabetes, heart failure or chronic kidney disease.;Long Term: Maintenance of blood pressure at goal levels.    Lipids  Yes    Intervention  Provide education and support for participant  on nutrition & aerobic/resistive exercise along with prescribed medications to achieve LDL <30m, HDL >420m    Expected Outcomes  Short Term: Participant states understanding of desired cholesterol values and is compliant with medications prescribed. Participant is following exercise prescription and nutrition guidelines.;Long Term: Cholesterol controlled with medications as prescribed, with individualized exercise RX and with personalized nutrition plan. Value goals: LDL < 704mHDL > 40 mg.       Core Components/Risk Factors/Patient Goals Review:  Goals and Risk Factor Review    Row Name 04/18/19 1449             Core Components/Risk Factors/Patient Goals Review   Personal Goals Review  Weight Management/Obesity;Lipids;Hypertension       Review  SteRichardson Landrys been doing well in rehab.  His weight has been steady and frustrating.  Today we reviewed home exercise and added intervals to boost weight loss.  He has not been checking his blood pressures as he does not have a cuff.  His numbers have been a little high in class, so he was encouraged to get a blood pressure cuff for home use and to record his numbers.       Expected Outcomes  Short: Get blood pressure cuff.  Long: Continue to work on weight loss.          Core Components/Risk Factors/Patient Goals at Discharge (Final Review):  Goals and Risk Factor Review - 04/18/19 1449      Core Components/Risk Factors/Patient Goals Review   Personal Goals Review  Weight Management/Obesity;Lipids;Hypertension    Review  SteRichardson Landrys been doing well in rehab.  His weight has been steady and frustrating.  Today we reviewed home  exercise and added intervals to boost weight loss.  He has not been checking his blood pressures as he does not have a cuff.  His numbers have been a little high in class, so he was encouraged to get a blood pressure cuff for home use and to record his numbers.    Expected Outcomes  Short: Get blood pressure cuff.  Long: Continue to work on weight loss.       ITP Comments: ITP Comments    Row Name 03/26/19 1039 04/01/19 1417 04/10/19 0707 04/23/19 0924 05/08/19 1050   ITP Comments  Virtual orientation completed today.  [facility]  Pt is scheduled for EP eval on Thurs 10/22 at 130pm.  First full day of exercise!  Patient was oriented to gym and equipment including functions, settings, policies, and procedures.  Patient's individual exercise prescription and treatment plan were reviewed.  All starting workloads were established based on the results of the 6 minute walk test done at initial orientation visit.  The plan for exercise progression was also introduced and progression will be customized based on patient's performance and goals.  30 day review completed. Continue with ITP sent to Dr. MarEmily Filbertedical Director of Cardiac and Pulmonary Rehab for review , changes as needed and signature.  New to program  Completed Initial RD eval  30 day review competed . ITP sent to Dr MarEmily Filbertr review, changes as needed and ITP approval signature.      Comments:

## 2019-05-09 ENCOUNTER — Encounter: Payer: BC Managed Care – PPO | Admitting: *Deleted

## 2019-05-09 DIAGNOSIS — I213 ST elevation (STEMI) myocardial infarction of unspecified site: Secondary | ICD-10-CM | POA: Diagnosis not present

## 2019-05-09 DIAGNOSIS — I2101 ST elevation (STEMI) myocardial infarction involving left main coronary artery: Secondary | ICD-10-CM

## 2019-05-09 DIAGNOSIS — Z955 Presence of coronary angioplasty implant and graft: Secondary | ICD-10-CM

## 2019-05-09 NOTE — Progress Notes (Signed)
Daily Session Note  Patient Details  Name: Trequan Marsolek MRN: [id number] Date of Birth: Sep 03, 1960 Referring Provider:     Cardiac Rehab from 03/28/2019 in [facility] Cardiac and Pulmonary Rehab  Referring Provider  [place]      Encounter Date: 05/09/2019  Check In: Session Check In - 05/09/19 1411      Check-In   Supervising physician immediately available to respond to emergencies  See telemetry face sheet for immediately available ER MD    Location  [facility]-Cardiac & Pulmonary Rehab    Staff Present  Renita Papa, RN BSN;Jessica Luan Pulling, MA, RCEP, CCRP, CCET;Jeanna Durrell BS, Exercise Physiologist    Virtual Visit  No    Medication changes reported      No    Fall or balance concerns reported     No    Warm-up and Cool-down  Performed on first and last piece of equipment    Resistance Training Performed  Yes    VAD Patient?  No    PAD/SET Patient?  No      Pain Assessment   Currently in Pain?  No/denies          Social History   Tobacco Use  Smoking Status Former Smoker  . Packs/day: 0.25  . Years: 1.00  . Pack years: 0.25  . Types: Cigarettes  Smokeless Tobacco Never Used  Tobacco Comment   [age] year old, hasn't smoked in 40 years    Goals Met:  Independence with exercise equipment Changing diet to healthy choices, watching portion sizes No report of cardiac concerns or symptoms Strength training completed today  Goals Unmet:  Not Applicable  Comments: Pt able to follow exercise prescription today without complaint.  Will continue to monitor for progression.    Dr. Emily Filbert is Medical Director for [place] and [facility].

## 2019-05-13 ENCOUNTER — Other Ambulatory Visit: Payer: Self-pay

## 2019-05-13 ENCOUNTER — Encounter: Payer: BC Managed Care – PPO | Admitting: *Deleted

## 2019-05-13 DIAGNOSIS — I213 ST elevation (STEMI) myocardial infarction of unspecified site: Secondary | ICD-10-CM | POA: Diagnosis not present

## 2019-05-13 DIAGNOSIS — I2101 ST elevation (STEMI) myocardial infarction involving left main coronary artery: Secondary | ICD-10-CM

## 2019-05-13 DIAGNOSIS — Z955 Presence of coronary angioplasty implant and graft: Secondary | ICD-10-CM

## 2019-05-13 NOTE — Progress Notes (Signed)
Daily Session Note  Patient Details  Name: Justin Lin MRN: [id number] Date of Birth: 05/16/1961 Referring Provider:     Cardiac Rehab from 03/28/2019 in [facility] Cardiac and Pulmonary Rehab  Referring Provider  [place]      Encounter Date: 05/13/2019  Check In: Session Check In - 05/13/19 1410      Check-In   Supervising physician immediately available to respond to emergencies  See telemetry face sheet for immediately available ER MD    Location  [facility]-Cardiac & Pulmonary Rehab    Staff Present  Meredith Craven, RN BSN;Kelly Hayes, BS, ACSM CEP, Exercise Physiologist;Joseph Hood RCP,RRT,BSRT    Virtual Visit  No    Medication changes reported      No    Fall or balance concerns reported     No    Warm-up and Cool-down  Performed on first and last piece of equipment    Resistance Training Performed  Yes    VAD Patient?  No    PAD/SET Patient?  No      Pain Assessment   Currently in Pain?  No/denies          Social History   Tobacco Use  Smoking Status Former Smoker  . Packs/day: 0.25  . Years: 1.00  . Pack years: 0.25  . Types: Cigarettes  Smokeless Tobacco Never Used  Tobacco Comment   15-16 year old, hasn't smoked in 40 years    Goals Met:  Independence with exercise equipment Exercise tolerated well No report of cardiac concerns or symptoms Strength training completed today  Goals Unmet:  Not Applicable  Comments: Pt able to follow exercise prescription today without complaint.  Will continue to monitor for progression.    Dr. Mark Miller is Medical Director for [facility] and [facility]. 

## 2019-05-15 ENCOUNTER — Other Ambulatory Visit: Payer: Self-pay

## 2019-05-15 ENCOUNTER — Encounter: Payer: BC Managed Care – PPO | Admitting: *Deleted

## 2019-05-15 DIAGNOSIS — Z955 Presence of coronary angioplasty implant and graft: Secondary | ICD-10-CM

## 2019-05-15 DIAGNOSIS — I2101 ST elevation (STEMI) myocardial infarction involving left main coronary artery: Secondary | ICD-10-CM

## 2019-05-15 DIAGNOSIS — I213 ST elevation (STEMI) myocardial infarction of unspecified site: Secondary | ICD-10-CM | POA: Diagnosis not present

## 2019-05-15 NOTE — Progress Notes (Signed)
Daily Session Note  Patient Details  Name: Justin Lin MRN: [id number] Date of Birth: 08/19/1960 Referring Provider:     Cardiac Rehab from 03/28/2019 in [facility] Cardiac and Pulmonary Rehab  Referring Provider  Paras[place]      Encounter Date: 05/15/2019  Check In: Session Check In - 05/15/19 1417      Check-In   Supervising physician immediately available to respond to emergencies  See telemetry face sheet for immediately available ER MD    Location  [facility]-Cardiac & Pulmonary Rehab    Staff Present  Renita Papa, RN Vickki Hearing, BA, ACSM CEP, Exercise Physiologist;Joseph Hood RCP,RRT,BSRT;Melissa Caiola RDN, LDN    Virtual Visit  No    Medication changes reported      Yes    Comments  taken of amiodarone    Fall or balance concerns reported     No    Warm-up and Cool-down  Performed on first and last piece of equipment    Resistance Training Performed  Yes    VAD Patient?  No    PAD/SET Patient?  No      Pain Assessment   Currently in Pain?  No/denies          Social History   Tobacco Use  Smoking Status Former Smoker  . Packs/day: 0.25  . Years: 1.00  . Pack years: 0.25  . Types: Cigarettes  Smokeless Tobacco Never Used  Tobacco Comment   40-60 year old, hasn't smoked in 40 years    Goals Met:  Independence with exercise equipment Exercise tolerated well No report of cardiac concerns or symptoms Strength training completed today  Goals Unmet:  Not Applicable  Comments: Pt able to follow exercise prescription today without complaint.  Will continue to monitor for progression.    Dr. Emily Filbert is Medical Director for [place] and [facility].

## 2019-05-16 ENCOUNTER — Encounter: Payer: BC Managed Care – PPO | Admitting: *Deleted

## 2019-05-16 DIAGNOSIS — I2101 ST elevation (STEMI) myocardial infarction involving left main coronary artery: Secondary | ICD-10-CM

## 2019-05-16 DIAGNOSIS — I213 ST elevation (STEMI) myocardial infarction of unspecified site: Secondary | ICD-10-CM | POA: Diagnosis not present

## 2019-05-16 DIAGNOSIS — Z955 Presence of coronary angioplasty implant and graft: Secondary | ICD-10-CM

## 2019-05-16 NOTE — Progress Notes (Signed)
Daily Session Note  Patient Details  Name: Justin Lin MRN: [id number] Date of Birth: 08/27/1960 Referring Provider:     Cardiac Rehab from 03/28/2019 in [facility] Cardiac and Pulmonary Rehab  Referring Provider  [place]      Encounter Date: 05/16/2019  Check In: Session Check In - 05/16/19 1418      Check-In   Supervising physician immediately available to respond to emergencies  See telemetry face sheet for immediately available ER MD    Location  [facility]-Cardiac & Pulmonary Rehab    Staff Present  Meredith Craven, RN BSN;Jessica Hawkins, MA, RCEP, CCRP, CCET;Joseph Hood RCP,RRT,BSRT    Virtual Visit  No    Medication changes reported      No    Fall or balance concerns reported     No    Warm-up and Cool-down  Performed on first and last piece of equipment    Resistance Training Performed  Yes    VAD Patient?  No    PAD/SET Patient?  No      Pain Assessment   Currently in Pain?  No/denies          Social History   Tobacco Use  Smoking Status Former Smoker  . Packs/day: 0.25  . Years: 1.00  . Pack years: 0.25  . Types: Cigarettes  Smokeless Tobacco Never Used  Tobacco Comment   15-16 year old, hasn't smoked in 40 years    Goals Met:  Independence with exercise equipment Exercise tolerated well No report of cardiac concerns or symptoms Strength training completed today  Goals Unmet:  Not Applicable  Comments: Pt able to follow exercise prescription today without complaint.  Will continue to monitor for progression. 6 Minute Walk    Row Name 03/28/19 1457         6 Minute Walk   Phase  Initial     Distance  1387 feet     Walk Time  6 minutes     MPH  2.62     METS  2.77     RPE  12     VO2 Peak  9.71     Symptoms  No     Resting HR  82 bpm     Resting BP  124/80     Max Ex. HR  112 bpm     Max Ex. BP  120/90     2 Minute Post BP  118/82          Dr. Mark Miller is Medical Director for [facility] and [facility]. 

## 2019-05-20 ENCOUNTER — Other Ambulatory Visit: Payer: Self-pay

## 2019-05-20 ENCOUNTER — Encounter: Payer: BC Managed Care – PPO | Admitting: *Deleted

## 2019-05-20 DIAGNOSIS — Z955 Presence of coronary angioplasty implant and graft: Secondary | ICD-10-CM

## 2019-05-20 DIAGNOSIS — I213 ST elevation (STEMI) myocardial infarction of unspecified site: Secondary | ICD-10-CM | POA: Diagnosis not present

## 2019-05-20 DIAGNOSIS — I2101 ST elevation (STEMI) myocardial infarction involving left main coronary artery: Secondary | ICD-10-CM

## 2019-05-20 NOTE — Progress Notes (Signed)
Daily Session Note  Patient Details  Name: Dicky Boer MRN: [id number] Date of Birth: 12-25-1960 Referring Provider:     Cardiac Rehab from 03/28/2019 in [facility] Cardiac and Pulmonary Rehab  Referring Provider  Paras[place]      Encounter Date: 05/20/2019  Check In: Session Check In - 05/20/19 1422      Check-In   Supervising physician immediately available to respond to emergencies  See telemetry face sheet for immediately available ER MD    Location  [facility]-Cardiac & Pulmonary Rehab    Staff Present  Renita Papa, RN Moises Blood, BS, ACSM CEP, Exercise Physiologist;Joseph Tessie Fass RCP,RRT,BSRT    Virtual Visit  No    Medication changes reported      No    Fall or balance concerns reported     No    Warm-up and Cool-down  Performed on first and last piece of equipment    Resistance Training Performed  Yes    VAD Patient?  No    PAD/SET Patient?  No      Pain Assessment   Currently in Pain?  No/denies          Social History   Tobacco Use  Smoking Status Former Smoker  . Packs/day: 0.25  . Years: 1.00  . Pack years: 0.25  . Types: Cigarettes  Smokeless Tobacco Never Used  Tobacco Comment   59-73 year old, hasn't smoked in 40 years    Goals Met:  Independence with exercise equipment Exercise tolerated well No report of cardiac concerns or symptoms Strength training completed today  Goals Unmet:  Not Applicable  Comments: Pt able to follow exercise prescription today without complaint.  Will continue to monitor for progression.    Dr. Emily Filbert is Medical Director for [place] and [facility].

## 2019-05-22 ENCOUNTER — Other Ambulatory Visit: Payer: Self-pay

## 2019-05-22 ENCOUNTER — Encounter: Payer: BC Managed Care – PPO | Admitting: *Deleted

## 2019-05-22 DIAGNOSIS — I2101 ST elevation (STEMI) myocardial infarction involving left main coronary artery: Secondary | ICD-10-CM

## 2019-05-22 DIAGNOSIS — Z955 Presence of coronary angioplasty implant and graft: Secondary | ICD-10-CM

## 2019-05-22 DIAGNOSIS — I213 ST elevation (STEMI) myocardial infarction of unspecified site: Secondary | ICD-10-CM | POA: Diagnosis not present

## 2019-05-22 NOTE — Progress Notes (Signed)
Daily Session Note  Patient Details  Name: Justin Lin MRN: [id number] Date of Birth: 1961/02/26 Referring Provider:     Cardiac Rehab from 03/28/2019 in [facility] Cardiac and Pulmonary Rehab  Referring Provider  Par[place]      Encounter Date: 05/22/2019  Check In: Session Check In - 05/22/19 1406      Check-In   Supervising physician immediately available to respond to emergencies  See telemetry face sheet for immediately available ER MD    Location  [facility]-Cardiac & Pulmonary Rehab    Staff Present  Renita Papa, RN Vickki Hearing, BA, ACSM CEP, Exercise Physiologist;Joseph Hood RCP,RRT,BSRT;Melissa Caiola RDN, LDN    Virtual Visit  No    Medication changes reported      No    Fall or balance concerns reported     No    Warm-up and Cool-down  Performed on first and last piece of equipment    Resistance Training Performed  Yes    VAD Patient?  No    PAD/SET Patient?  No      Pain Assessment   Currently in Pain?  No/denies          Social History   Tobacco Use  Smoking Status Former Smoker  . Packs/day: 0.25  . Years: 1.00  . Pack years: 0.25  . Types: Cigarettes  Smokeless Tobacco Never Used  Tobacco Comment   50-25 year old, hasn't smoked in 40 years    Goals Met:  Independence with exercise equipment Exercise tolerated well No report of cardiac concerns or symptoms Strength training completed today  Goals Unmet:  Not Applicable  Comments: Pt able to follow exercise prescription today without complaint.  Will continue to monitor for progression.    Dr. Emily Filbert is Medical Director for [place] and [facility].

## 2019-05-23 ENCOUNTER — Other Ambulatory Visit: Payer: Self-pay

## 2019-05-23 ENCOUNTER — Encounter: Payer: BC Managed Care – PPO | Admitting: *Deleted

## 2019-05-23 DIAGNOSIS — Z955 Presence of coronary angioplasty implant and graft: Secondary | ICD-10-CM

## 2019-05-23 DIAGNOSIS — I2101 ST elevation (STEMI) myocardial infarction involving left main coronary artery: Secondary | ICD-10-CM

## 2019-05-23 DIAGNOSIS — I213 ST elevation (STEMI) myocardial infarction of unspecified site: Secondary | ICD-10-CM | POA: Diagnosis not present

## 2019-05-23 NOTE — Progress Notes (Signed)
Daily Session Note  Patient Details  Name: Justin Lin MRN: [id number] Date of Birth: [birth year] Referring Provider:     Cardiac Rehab from 03/28/2019 in [facility] Cardiac and Pulmonary Rehab  Referring Provider  Par[place]      Encounter Date: 05/23/2019  Check In: Session Check In - 05/23/19 1408      Check-In   Supervising physician immediately available to respond to emergencies  See telemetry face sheet for immediately available ER MD    Location  [facility]-Cardiac & Pulmonary Rehab    Staff Present  Renita Papa, RN BSN;Jessica Luan Pulling, MA, RCEP, CCRP, CCET;Joseph Dunmor RCP,RRT,BSRT    Virtual Visit  No    Medication changes reported      No    Fall or balance concerns reported     No    Warm-up and Cool-down  Performed on first and last piece of equipment    Resistance Training Performed  Yes    VAD Patient?  No    PAD/SET Patient?  No      Pain Assessment   Currently in Pain?  No/denies          Social History   Tobacco Use  Smoking Status Former Smoker  . Packs/day: 0.25  . Years: 1.00  . Pack years: 0.25  . Types: Cigarettes  Smokeless Tobacco Never Used  Tobacco Comment   17-10 year old, hasn't smoked in 40 years    Goals Met:  Independence with exercise equipment Exercise tolerated well No report of cardiac concerns or symptoms Strength training completed today  Goals Unmet:  Not Applicable  Comments:  Justin Lin graduated today from  rehab with 24 sessions completed.  Details of the patient's exercise prescription and what He needs to do in order to continue the prescription and progress were discussed with patient.  Patient was given a copy of prescription and goals.  Patient verbalized understanding.  Justin Lin plans to continue to exercise by walking on the treadmill and recumbent bike.     Dr. Emily Filbert is Medical Director for [place] and [facility].

## 2019-05-23 NOTE — Progress Notes (Signed)
Discharge Progress Report  Patient Details  Name: Justin Lin MRN: [id number] Date of Birth: 1960-11-03 Referring Provider:     Cardiac Rehab from 03/28/2019 in [facility]  Referring Provider  Paraschos       Number of Visits: 24  Reason for Discharge:  Patient reached a stable level of exercise. Patient independent in their exercise.  Smoking History:  Social History   Tobacco Use  Smoking Status Former Smoker  . Packs/day: 0.25  . Years: 1.00  . Pack years: 0.25  . Types: Cigarettes  Smokeless Tobacco Never Used  Tobacco Comment   43-87 year old, hasn't smoked in 40 years    Diagnosis:  ST elevation myocardial infarction involving left main coronary artery (HCC)  Status post coronary artery stent placement  ADL [facility]:   Initial Exercise Prescription: Initial Exercise Prescription - 03/28/19 1500      Date of Initial Exercise RX and Referring Provider   Date  03/28/19    Referring Provider  Paraschos      Treadmill   MPH  2.5    Grade  1    Minutes  15    METs  2.7      Recumbant Bike   Level  2    RPM  80    Watts  40    Minutes  15    METs  3      NuStep   Level  3    SPM  80    Minutes  15    METs  3      Elliptical   Level  1    Speed  3.5    Minutes  15    METs  3      REL-XR   Level  2    Watts  40    Speed  50    Minutes  15    METs  3      Prescription Details   Duration  Progress to 30 minutes of continuous aerobic without signs/symptoms of physical distress      Intensity   THRR 40-80% of Max Heartrate  113-145    Ratings of Perceived Exertion  11-15    Perceived Dyspnea  0-4      Progression   Progression  Continue progressive overload as per policy without signs/symptoms or physical distress.      Resistance Training   Training Prescription  Yes    Weight  3    Reps  10-15       Discharge Exercise Prescription (Final Exercise Prescription Changes): Exercise Prescription Changes -  05/16/19 1300      Response to Exercise   Blood Pressure (Admit)  140/88    Blood Pressure (Exercise)  154/84    Blood Pressure (Exit)  120/78    Heart Rate (Admit)  78 bpm    Heart Rate (Exercise)  107 bpm    Heart Rate (Exit)  80 bpm    Rating of Perceived Exertion (Exercise)  15    Symptoms  none    Duration  Continue with 30 min of aerobic exercise without signs/symptoms of physical distress.    Intensity  THRR unchanged      Progression   Progression  Continue to progress workloads to maintain intensity without signs/symptoms of physical distress.    Average METs  4.82      Resistance Training   Training Prescription  Yes    Weight  7  lb    Reps  10-15      Interval Training   Interval Training  Yes    Equipment  Treadmill;Recumbant Bike;Elliptical    Comments  30 sec on and 1 min off      Treadmill   MPH  2.9    Grade  4   to 12 on intervals   Minutes  15    METs  4.82      Elliptical   Level  12    Speed  4.6    Minutes  15      T5 Nustep   Level  5    SPM  80    Minutes  15    METs  4.5      Home Exercise Plan   Plans to continue exercise at  Home (comment)   walking, treadmill, recumbent bike   Frequency  Add 2 additional days to program exercise sessions.    Initial Home Exercises Provided  04/18/19       Functional Capacity: 6 Minute Walk    Row Name 03/28/19 1457 05/16/19 1436       6 Minute Walk   Phase  Initial  Discharge    Distance  1387 feet  1810 feet    Distance % Change  --  30.5 %    Distance Feet Change  --  423 ft    Walk Time  6 minutes  6 minutes    # of Rest Breaks  --  0    MPH  2.62  3.43    METS  2.77  4.35    RPE  12  15    VO2 Peak  9.71  15.22    Symptoms  No  No    Resting HR  82 bpm  73 bpm    Resting BP  124/80  134/72    Max Ex. HR  112 bpm  124 bpm    Max Ex. BP  120/90  164/80    2 Minute Post BP  118/82  --       Psychological, QOL, Others - Outcomes: PHQ 2/9: Depression screen [facility] [place] 2/9 05/23/2019  05/09/2019 03/28/2019  Decreased Interest 0 0 0  Down, Depressed, Hopeless 0 0 0  PHQ - 2 Score 0 0 0  Altered sleeping 0 0 3  Tired, decreased energy 0 0 1  Change in appetite 0 0 2  Feeling bad or failure about yourself  0 0 1  Trouble concentrating 0 0 0  Moving slowly or fidgety/restless 0 0 1  Suicidal thoughts 0 0 0  PHQ-9 Score 0 0 8  Difficult doing work/chores - - Somewhat difficult    Quality of Life: Quality of Life - 05/23/19 1424      Quality of Life Scores   Health/Function Pre  20.1 %    Health/Function Post  27.1 %    Health/Function % Change  34.83 %    Socioeconomic Pre  24 %    Socioeconomic Post  26.5 %    Socioeconomic % Change   10.42 %    Psych/Spiritual Pre  24 %    Psych/Spiritual Post  25.5 %    Psych/Spiritual % Change  6.25 %    Family Pre  27.6 %    Family Post  28.8 %    Family % Change  4.35 %    GLOBAL Pre  22.86 %    GLOBAL Post  26.9 %  GLOBAL % Change  17.67 %       Personal Goals: Goals established at orientation with interventions provided to work toward goal. Personal Goals and Risk Factors at Admission - 03/26/19 1048      Core Components/Risk Factors/Patient Goals on Admission    Weight Management  Yes;Obesity;Weight Loss    Intervention  Weight Management: Develop a combined nutrition and exercise program designed to reach desired caloric intake, while maintaining appropriate intake of nutrient and fiber, sodium and fats, and appropriate energy expenditure required for the weight goal.;Weight Management: Provide education and appropriate resources to help participant work on and attain dietary goals.;Weight Management/Obesity: Establish reasonable short term and long term weight goals.;Obesity: Provide education and appropriate resources to help participant work on and attain dietary goals.    Expected Outcomes  Short Term: Continue to assess and modify interventions until short term weight is achieved;Long Term: Adherence to  nutrition and physical activity/exercise program aimed toward attainment of established weight goal;Weight Loss: Understanding of general recommendations for a balanced deficit meal plan, which promotes 1-2 lb weight loss per week and includes a negative energy balance of [phone] kcal/d;Understanding of distribution of calorie intake throughout the day with the consumption of 4-5 meals/snacks;Understanding recommendations for meals to include 15-35% energy as protein, 25-35% energy from fat, 35-60% energy from carbohydrates, less than 200mg  of dietary cholesterol, 20-35 gm of total fiber daily    Hypertension  Yes    Intervention  Provide education on lifestyle modifcations including regular physical activity/exercise, weight management, moderate sodium restriction and increased consumption of fresh fruit, vegetables, and low fat dairy, alcohol moderation, and smoking cessation.;Monitor prescription use compliance.    Expected Outcomes  Short Term: Continued assessment and intervention until BP is < 140/100mm HG in hypertensive participants. < 130/72mm HG in hypertensive participants with diabetes, heart failure or chronic kidney disease.;Long Term: Maintenance of blood pressure at goal levels.    Lipids  Yes    Intervention  Provide education and support for participant on nutrition & aerobic/resistive exercise along with prescribed medications to achieve LDL 70mg , HDL >40mg .    Expected Outcomes  Short Term: Participant states understanding of desired cholesterol values and is compliant with medications prescribed. Participant is following exercise prescription and nutrition guidelines.;Long Term: Cholesterol controlled with medications as prescribed, with individualized exercise RX and with personalized nutrition plan. Value goals: LDL < 70mg , HDL > 40 mg.        Personal Goals Discharge: Goals and Risk Factor Review    Row Name 04/18/19 1449             Core Components/Risk Factors/Patient  Goals Review   Personal Goals Review  Weight Management/Obesity;Lipids;Hypertension       Review  has been doing well in rehab.  His weight has been steady and frustrating.  Today we reviewed home exercise and added intervals to boost weight loss.  He has not been checking his blood pressures as he does not have a cuff.  His numbers have been a little high in class, so he was encouraged to get a blood pressure cuff for home use and to record his numbers.       Expected Outcomes  Short: Get blood pressure cuff.  Long: Continue to work on weight loss.          Exercise Goals and Review: Exercise Goals    Row Name 03/28/19 1411             Exercise Goals  Increase Physical Activity  Yes       Intervention  Provide advice, education, support and counseling about physical activity/exercise needs.;Develop an individualized exercise prescription for aerobic and resistive training based on initial evaluation findings, risk stratification, comorbidities and participant's personal goals.       Expected Outcomes  Short Term: Attend rehab on a regular basis to increase amount of physical activity.;Long Term: Add in home exercise to make exercise part of routine and to increase amount of physical activity.;Long Term: Exercising regularly at least 3-5 days a week.       Increase Strength and Stamina  Yes       Intervention  Provide advice, education, support and counseling about physical activity/exercise needs.;Develop an individualized exercise prescription for aerobic and resistive training based on initial evaluation findings, risk stratification, comorbidities and participant's personal goals.       Expected Outcomes  Short Term: Increase workloads from initial exercise prescription for resistance, speed, and METs.;Short Term: Perform resistance training exercises routinely during rehab and add in resistance training at home;Long Term: Improve cardiorespiratory fitness, muscular endurance and  strength as measured by increased METs and functional capacity ( )       Able to understand and use rate of perceived exertion (RPE) scale  Yes       Intervention  Provide education and explanation on how to use RPE scale       Expected Outcomes  Short Term: Able to use RPE daily in rehab to express subjective intensity level;Long Term:  Able to use RPE to guide intensity level when exercising independently       Able to understand and use Dyspnea scale  Yes       Intervention  Provide education and explanation on how to use Dyspnea scale       Expected Outcomes  Short Term: Able to use Dyspnea scale daily in rehab to express subjective sense of shortness of breath during exertion;Long Term: Able to use Dyspnea scale to guide intensity level when exercising independently       Knowledge and understanding of Target Heart Rate Range (THRR)  Yes       Intervention  Provide education and explanation of THRR including how the numbers were predicted and where they are located for reference       Expected Outcomes  Short Term: Able to state/look up THRR;Short Term: Able to use daily as guideline for intensity in rehab;Long Term: Able to use THRR to govern intensity when exercising independently       Able to check pulse independently  Yes       Intervention  Provide education and demonstration on how to check pulse in carotid and radial arteries.;Review the importance of being able to check your own pulse for safety during independent exercise       Expected Outcomes  Short Term: Able to explain why pulse checking is important during independent exercise;Long Term: Able to check pulse independently and accurately       Understanding of Exercise Prescription  Yes       Intervention  Provide education, explanation, and written materials on patient's individual exercise prescription       Expected Outcomes  Short Term: Able to explain program exercise prescription;Long Term: Able to explain home exercise  prescription to exercise independently          Exercise Goals Re-Evaluation: Exercise Goals Re-Evaluation    Row Name 04/01/19 1418 04/17/19 1339 04/18/19 1442 04/29/19 1341 05/16/19 1340  Exercise Goal Re-Evaluation   Exercise Goals Review  Able to understand and use rate of perceived exertion (RPE) scale;Increase Physical Activity;Knowledge and understanding of Target Heart Rate Range (THRR);Understanding of Exercise Prescription;Able to check pulse independently;Increase Strength and Stamina  Increase Physical Activity;Increase Strength and Stamina;Understanding of Exercise Prescription  Increase Physical Activity;Increase Strength and Stamina;Understanding of Exercise Prescription  Increase Physical Activity;Increase Strength and Stamina;Understanding of Exercise Prescription  Increase Strength and Stamina;Able to understand and use rate of perceived exertion (RPE) scale;Able to understand and use Dyspnea scale;Knowledge and understanding of Target Heart Rate Range (THRR);Able to check pulse independently;Understanding of Exercise Prescription   Comments  Reviewed RPE scale, THR and program prescription with pt today.  Pt voiced understanding and was given a copy of goals to take home.  Brett Canales is off to a great start in rehab.  He doing well on the elliptical and up to level 7 on XR.  We will continue to monitor his progress.  Brett Canales is doing well. He is feeling stronger and started intervals today. Reviewed home exercise with pt today.  Pt plans to walk and use treadmill and bike at home for exercise.  He also has weight equipment at home.  Reviewed THR, pulse, RPE, sign and symptoms, NTG use, and when to call 911 or MD.  Also discussed weather considerations and indoor options.  Pt voiced understanding.  Brett Canales is doing well in rehab.  He is up to level 117 watts on the recumbent bike.  We will continue to monitor his progress.  Brett Canales plans to [place] early from [facility].  He is having some issues with  care for his son.  He has progressed well and has added intervals to his program.   Expected Outcomes  Short: Use RPE daily to regulate intensity. Long: Follow program prescription in THR.  Short: Review home exercise guidelines.  Long: Continue to improve stamina.  Short: Start add exercise in at home.  Long: Continue to improve stamina.  Short: Continue to use intervals.  Long: Continue to exercise more at home.  Short:  complete HT sessions Long - miantain exerciseon his own      Nutrition & Weight - Outcomes:    Nutrition: Nutrition Therapy & Goals - 04/23/19 0900      Nutrition Therapy   Diet  HH, low Na    Protein (specify units)  110g    Fiber  30 grams    Whole Grain Foods  3 servings    Saturated Fats  12 max. grams    Fruits and Vegetables  5 servings/day    Sodium  1.5 grams      Personal Nutrition Goals   Nutrition Goal  ST: continue HH eating LT: Lose weight (goal wt), eat healthy and be healthy    Comments  B: oatmeal w/ banana L: grilled chicken in a salad, tilapia with some vegetables D: chicken 3-4x/week, trying to limit red meat, eat more salads, more fruits (pineapples and strawberries), limiting sweets (when he does eat sweets he makes them). Snacks mixed unsalted peanuts. Discussed HH eating. Pt reports honoring hunger and feeling good. Pt made many diet changes since leaving the hospital, will continue to monitor progress.      Intervention Plan   Intervention  Prescribe, educate and counsel regarding individualized specific dietary modifications aiming towards targeted core components such as weight, hypertension, lipid management, diabetes, heart failure and other comorbidities.;Nutrition handout(s) given to patient.    Expected Outcomes  Short Term Goal: Understand  basic principles of dietary content, such as calories, fat, sodium, cholesterol and nutrients.;Short Term Goal: A plan has been developed with personal nutrition goals set during dietitian  appointment.;Long Term Goal: Adherence to prescribed nutrition plan.       Nutrition Discharge: Nutrition Assessments - 05/23/19 1425      MEDFICTS Scores   Post Score  22       Education Questionnaire Score: Knowledge Questionnaire Score - 05/23/19 1425      Knowledge Questionnaire Score   Post Score  26/26       Goals reviewed with patient; copy given to patient.

## 2019-05-23 NOTE — Progress Notes (Signed)
Cardiac Individual Treatment Plan  Patient Details  Name: Justin Lin MRN: [id number] Date of Birth: Nov 07, 1960 Referring Provider:     Cardiac Rehab from 03/28/2019 in [facility] Cardiac and Pulmonary Rehab  Referring Provider  Paraschos      Initial Encounter Date:    Cardiac Rehab from 03/28/2019 in [facility] Cardiac and Pulmonary Rehab  Date  03/28/19      Visit Diagnosis: ST elevation myocardial infarction involving left main coronary artery [facility])  Status post coronary artery stent placement  Patient's Home Medications on Admission:  Current Outpatient Medications:  .  acetaminophen (TYLENOL) 325 MG tablet, Take 2 tablets (650 mg total) by mouth every 4 (four) hours as needed for headache or mild pain., Disp: , Rfl:  .  amiodarone (PACERONE) 200 MG tablet, Take 2 tablets (400 mg total) by mouth 2 (two) times daily. Take 400 mg twice a day for 1 week followed by 200 mg twice a day, Disp: 90 tablet, Rfl: 0 .  aspirin EC 81 MG tablet, Take 1 tablet (81 mg total) by mouth daily., Disp: 150 tablet, Rfl: 2 .  atorvastatin (LIPITOR) 80 MG tablet, Take 1 tablet (80 mg total) by mouth daily at 6 PM., Disp: 30 tablet, Rfl: 0 .  lisinopril (ZESTRIL) 2.5 MG tablet, Take 1 tablet (2.5 mg total) by mouth daily., Disp: 30 tablet, Rfl: 0 .  loratadine (CLARITIN) 10 MG tablet, Take 10 mg by mouth daily as needed for allergies., Disp: , Rfl:  .  metoprolol succinate (TOPROL-XL) 50 MG 24 hr tablet, Take 1 tablet (50 mg total) by mouth daily. Take with or immediately following a meal., Disp: 30 tablet, Rfl: 0 .  nitroGLYCERIN (NITROSTAT) 0.4 MG SL tablet, Place 1 tablet (0.4 mg total) under the tongue every 5 (five) minutes x 3 doses as needed for chest pain., Disp: 10 tablet, Rfl: 0 .  ticagrelor (BRILINTA) 90 MG TABS tablet, Take 1 tablet (90 mg total) by mouth 2 (two) times daily., Disp: 60 tablet, Rfl: 0  Past Medical History: Past Medical History:  Diagnosis Date  . Kidney stone     Tobacco  Use: Social History   Tobacco Use  Smoking Status Former Smoker  . Packs/day: 0.25  . Years: 1.00  . Pack years: 0.25  . Types: Cigarettes  Smokeless Tobacco Never Used  Tobacco Comment   20-58 year old, hasn't smoked in 40 years    Labs: Recent Review Flowsheet Data    Labs for ITP Cardiac and Pulmonary Rehab Latest Ref Rng & Units 03/21/2019 03/22/2019   Cholestrol 0 - 200 mg/dL 206(H) 195   LDLCALC 0 - 99 mg/dL 145(H) 143(H)   HDL >40 mg/dL 37(L) 31(L)   Trlycerides <150 mg/dL 119 103       Exercise Target Goals: Exercise Program Goal: Individual exercise prescription set using results from initial 6 min walk test and THRR while considering  patient's activity barriers and safety.   Exercise Prescription Goal: Initial exercise prescription builds to 30-45 minutes a day of aerobic activity, 2-3 days per week.  Home exercise guidelines will be given to patient during program as part of exercise prescription that the participant will acknowledge.  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 HR  112 bpm  124 bpm    Max Ex. BP  120/90  164/80    2 Minute Post BP  118/82  -       Oxygen Initial Assessment:   Oxygen  Re-Evaluation:   Oxygen Discharge (Final Oxygen Re-Evaluation):   Initial Exercise Prescription: Initial Exercise Prescription - 03/28/19 1500      Date of Initial Exercise RX and Referring Provider   Date  03/28/19    Referring Provider  Paraschos      Treadmill   MPH  2.5    Grade  1    Minutes  15    METs  2.7      Recumbant Bike   Level  2    RPM  80    Watts  40    Minutes  15    METs  3      NuStep   Level  3    SPM  80    Minutes  15    METs  3      Elliptical   Level  1    Speed  3.5    Minutes  15    METs  3      REL-XR   Level  2    Watts  40    Speed  50    Minutes  15    METs  3      Prescription Details   Duration  Progress to 30 minutes of continuous aerobic without signs/symptoms of physical distress      Intensity   THRR 40-80% of Max Heartrate  113-145    Ratings of Perceived Exertion  11-15    Perceived Dyspnea  0-4      Progression   Progression  Continue progressive overload as per policy without signs/symptoms or physical distress.      Resistance Training   Training Prescription  Yes    Weight  3    Reps  10-15       Perform Capillary Blood Glucose checks as needed.  Exercise Prescription Changes:  Exercise Prescription Changes    Row Name 04/17/19 1300 04/18/19 1400 04/29/19 1300 05/16/19 1300       Response to Exercise   Blood Pressure (Admit)  130/80  -  122/80  140/88    Blood Pressure (Exercise)  140/70  -  138/100  154/84    Blood Pressure (Exit)  110/70  -  122/74  120/78    Heart Rate (Admit)  75 bpm  -  73 bpm  78 bpm    Heart Rate (Exercise)  119 bpm  -  112 bpm  107 bpm    Heart Rate (Exit)  76 bpm  -  75 bpm  80 bpm    Rating of Perceived Exertion (Exercise)  14  -  15  15    Symptoms  none  -  none  none    Duration  Continue with 30 min of aerobic exercise without signs/symptoms of physical distress.  -  Continue with 30 min of aerobic exercise without signs/symptoms of physical distress.  Continue  with 30 min of aerobic exercise without signs/symptoms of physical distress.    Intensity  THRR unchanged  -  THRR unchanged  THRR unchanged      Progression   Progression  Continue to progress workloads to maintain intensity without signs/symptoms of physical distress.  -  Continue to progress workloads to maintain intensity without signs/symptoms of physical distress.  Continue to progress workloads to maintain intensity without signs/symptoms of physical distress.    Average METs  3.87  -  6.34  4.82      Resistance Training   Training Prescription  Yes  -  Yes  Yes    Weight  3 lb  -  3 lb  7 lb    Reps  10-15  -  10-15  10-15      Interval Training   Interval Training  No  -  Yes  Yes    Equipment  -  -  Recumbant Bike;REL-XR  Treadmill;Recumbant Bike;Elliptical    Comments  -  -  30 sec on and 1 min off  30 sec on and 1 min off      Treadmill   MPH  2.5  -  2.9  2.9    Grade  3.5  -  4  4 to 12 on intervals    Minutes  15  -  15  15    METs  4.12  -  4.82  4.82      Recumbant Bike   Level  5  -  5  -    Watts  -  -  117  -    Minutes  15  -  15  -    METs  -  -  4.9  -      Elliptical   Level  1  -  12  12    Speed  4.4  -  4.6  4.6    Minutes  15  -  15  15      REL-XR   Level  7  -  7  -    Minutes  15  -  15  -    METs  3.2  -  9.4  -      T5 Nustep   Level  2  -  -  5    SPM  -  -  -  80    Minutes  15  -  -  15    METs  4.3  -  -  4.5      Home Exercise Plan   Plans to continue exercise at  -  Home (comment) walking, treadmill, recumbent bike  Home (comment) walking, treadmill, recumbent bike  Home (comment) walking, treadmill, recumbent bike    Frequency  -  Add 2 additional days to program exercise sessions.  Add 2 additional days to program exercise sessions.  Add 2 additional days to program exercise sessions.    Initial Home Exercises Provided  -  04/18/19  04/18/19  04/18/19       Exercise Comments:   Exercise Goals and Review:  Exercise Goals     Row Name 03/28/19 1411             Exercise Goals   Increase Physical Activity  Yes       Intervention  Provide advice, education, support and counseling about physical activity/exercise needs.;Develop an individualized exercise prescription for aerobic and resistive training based on initial evaluation findings, risk stratification, comorbidities and participant's personal goals.       Expected Outcomes  Short Term: Attend rehab on a regular basis to increase amount of physical activity.;Long Term: Add in home exercise to make exercise part of routine and to increase amount of physical activity.;Long Term: Exercising regularly at least 3-5 days a week.       Increase Strength and Stamina  Yes       Intervention  Provide advice, education, support and counseling about physical activity/exercise needs.;Develop an individualized exercise prescription for aerobic and resistive training based on initial evaluation findings, risk stratification, comorbidities and participant's personal goals.       Expected Outcomes  Short Term: Increase workloads from initial exercise prescription for resistance, speed, and METs.;Short Term: Perform resistance training exercises routinely during rehab and add in resistance training at home;Long Term: Improve cardiorespiratory fitness, muscular endurance and strength as measured by increased METs and functional capacity (6MWT)       Able to understand and use rate of perceived exertion (RPE) scale  Yes       Intervention  Provide education and explanation on how to use RPE scale       Expected Outcomes  Short Term: Able to use RPE daily in rehab to express subjective intensity level;Long Term:  Able to use RPE to guide intensity level when exercising independently       Able to understand and use Dyspnea scale  Yes       Intervention  Provide education and explanation on how to use Dyspnea scale       Expected Outcomes  Short Term: Able to use Dyspnea scale daily in  rehab to express subjective sense of shortness of breath during exertion;Long Term: Able to use Dyspnea scale to guide intensity level when exercising independently       Knowledge and understanding of Target Heart Rate Range (THRR)  Yes       Intervention  Provide education and explanation of THRR including how the numbers were predicted and where they are located for reference       Expected Outcomes  Short Term: Able to state/look up THRR;Short Term: Able to use daily as guideline for intensity in rehab;Long Term: Able to use THRR to govern intensity when exercising independently       Able to check pulse independently  Yes       Intervention  Provide education and demonstration on how to check pulse in carotid and radial arteries.;Review the importance of being able to check your own pulse for safety during independent exercise       Expected Outcomes  Short Term: Able to explain why pulse checking is important during independent exercise;Long Term: Able to check pulse independently and accurately       Understanding of Exercise Prescription  Yes       Intervention  Provide education, explanation, and written materials  on patient's individual exercise prescription       Expected Outcomes  Short Term: Able to explain program exercise prescription;Long Term: Able to explain home exercise prescription to exercise independently          Exercise Goals Re-Evaluation : Exercise Goals Re-Evaluation    Row Name 04/01/19 1418 04/17/19 1339 04/18/19 1442 04/29/19 1341 05/16/19 1340     Exercise Goal Re-Evaluation   Exercise Goals Review  Able to understand and use rate of perceived exertion (RPE) scale;Increase Physical Activity;Knowledge and understanding of Target Heart Rate Range (THRR);Understanding of Exercise Prescription;Able to check pulse independently;Increase Strength and Stamina  Increase Physical Activity;Increase Strength and Stamina;Understanding of Exercise Prescription  Increase Physical  Activity;Increase Strength and Stamina;Understanding of Exercise Prescription  Increase Physical Activity;Increase Strength and Stamina;Understanding of Exercise Prescription  Increase Strength and Stamina;Able to understand and use rate of perceived exertion (RPE) scale;Able to understand and use Dyspnea scale;Knowledge and understanding of Target Heart Rate Range (THRR);Able to check pulse independently;Understanding of Exercise Prescription   Comments  Reviewed RPE scale, THR and program prescription with pt today.  Pt voiced understanding and was given a copy of goals to take home.  Justin Lin is off to a great start in rehab.  He doing well on the elliptical and up to level 7 on XR.  We will continue to monitor his progress.  Justin Lin is doing well. He is feeling stronger and started intervals today. Reviewed home exercise with pt today.  Pt plans to walk and use treadmill and bike at home for exercise.  He also has weight equipment at home.  Reviewed THR, pulse, RPE, sign and symptoms, NTG use, and when to call 911 or MD.  Also discussed weather considerations and indoor options.  Pt voiced understanding.  Justin Lin is doing well in rehab.  He is up to level 117 watts on the recumbent bike.  We will continue to monitor his progress.  Justin Lin plans to [place] early from [facility].  He is having some issues with care for his son.  He has progressed well and has added intervals to his program.   Expected Outcomes  Short: Use RPE daily to regulate intensity. Long: Follow program prescription in THR.  Short: Review home exercise guidelines.  Long: Continue to improve stamina.  Short: Start add exercise in at home.  Long: Continue to improve stamina.  Short: Continue to use intervals.  Long: Continue to exercise more at home.  Short:  complete HT sessions Long - miantain exerciseon his own      Discharge Exercise Prescription (Final Exercise Prescription Changes): Exercise Prescription Changes - 05/16/19 1300      Response to  Exercise   Blood Pressure (Admit)  140/88    Blood Pressure (Exercise)  154/84    Blood Pressure (Exit)  120/78    Heart Rate (Admit)  78 bpm    Heart Rate (Exercise)  107 bpm    Heart Rate (Exit)  80 bpm    Rating of Perceived Exertion (Exercise)  15    Symptoms  none    Duration  Continue with 30 min of aerobic exercise without signs/symptoms of physical distress.    Intensity  THRR unchanged      Progression   Progression  Continue to progress workloads to maintain intensity without signs/symptoms of physical distress.    Average METs  4.82      Resistance Training   Training Prescription  Yes    Weight  7 lb  Reps  10-15      Interval Training   Interval Training  Yes    Equipment  Treadmill;Recumbant Bike;Elliptical    Comments  30 sec on and 1 min off      Treadmill   MPH  2.9    Grade  4   to 12 on intervals   Minutes  15    METs  4.82      Elliptical   Level  12    Speed  4.6    Minutes  15      T5 Nustep   Level  5    SPM  80    Minutes  15    METs  4.5      Home Exercise Plan   Plans to continue exercise at  Home (comment)   walking, treadmill, recumbent bike   Frequency  Add 2 additional days to program exercise sessions.    Initial Home Exercises Provided  04/18/19       Nutrition:  Target Goals: Understanding of nutrition guidelines, daily intake of sodium <1565m, cholesterol <2057m calories 30% from fat and 7% or less from saturated fats, daily to have 5 or more servings of fruits and vegetables.  Biometrics:    Nutrition Therapy Plan and Nutrition Goals: Nutrition Therapy & Goals - 04/23/19 0900      Nutrition Therapy   Diet  HH, low Na    Protein (specify units)  110g    Fiber  30 grams    Whole Grain Foods  3 servings    Saturated Fats  12 max. grams    Fruits and Vegetables  5 servings/day    Sodium  1.5 grams      Personal Nutrition Goals   Nutrition Goal  ST: continue HH eating LT: Lose weight (goal wt), eat healthy and  be healthy    Comments  B: oatmeal w/ banana L: grilled chicken in a salad, tilapia with some vegetables D: chicken 3-4x/week, trying to limit red meat, eat more salads, more fruits (pineapples and strawberries), limiting sweets (when he does eat sweets he makes them). Snacks mixed unsalted peanuts. Discussed HH eating. Pt reports honoring hunger and feeling good. Pt made many diet changes since leaving the hospital, will continue to monitor progress.      Intervention Plan   Intervention  Prescribe, educate and counsel regarding individualized specific dietary modifications aiming towards targeted core components such as weight, hypertension, lipid management, diabetes, heart failure and other comorbidities.;Nutrition handout(s) given to patient.    Expected Outcomes  Short Term Goal: Understand basic principles of dietary content, such as calories, fat, sodium, cholesterol and nutrients.;Short Term Goal: A plan has been developed with personal nutrition goals set during dietitian appointment.;Long Term Goal: Adherence to prescribed nutrition plan.       Nutrition Assessments: Nutrition Assessments - 05/23/19 1425      MEDFICTS Scores   Post Score  22       Nutrition Goals Re-Evaluation: Nutrition Goals Re-Evaluation    [place] 05/13/19 1423             Goals   Nutrition Goal  ST: continue HH eating LT: Lose weight (goal wt), eat healthy and be healthy       Comment  Pt will continue with current changes, no questions or concerns at this time. Pt reports energy level and strength is "fantastic". Pt reports still honoring hunger.       Expected Outcome  ST: continue  HH eating LT: Lose weight (goal wt), eat healthy and be healthy          Nutrition Goals Discharge (Final Nutrition Goals Re-Evaluation): Nutrition Goals Re-Evaluation - 05/13/19 1423      Goals   Nutrition Goal  ST: continue HH eating LT: Lose weight (goal wt), eat healthy and be healthy    Comment  Pt will continue  with current changes, no questions or concerns at this time. Pt reports energy level and strength is "fantastic". Pt reports still honoring hunger.    Expected Outcome  ST: continue HH eating LT: Lose weight (goal wt), eat healthy and be healthy       Psychosocial: Target Goals: Acknowledge presence or absence of significant depression and/or stress, maximize coping skills, provide positive support system. Participant is able to verbalize types and ability to use techniques and skills needed for reducing stress and depression.   Initial Review & Psychosocial Screening: Initial Psych Review & Screening - 03/26/19 1012      Initial Review   Current issues with  Current Stress Concerns;Current Anxiety/Panic    Source of Stress Concerns  Family    Comments  Has a son 75 yo with down syndrome that lives with him.  He is a single parent and manages his care.  His son's mom does not support him in any way and that was why he retired early.      Family Dynamics   Good Support System?  Yes   Justin Lin, girlfriend for 30 years, Justin Lin, his son's grandmother, and brother and sister that all live nearby   Comments  He has been a little more emotional with everything since getting home.      Barriers   Psychosocial barriers to participate in program  The patient should benefit from training in stress management and relaxation.;Psychosocial barriers identified (see note)      Screening Interventions   Interventions  To provide support and resources with identified psychosocial needs;Provide feedback about the scores to participant    Expected Outcomes  Short Term goal: Utilizing psychosocial counselor, staff and physician to assist with identification of specific Stressors or current issues interfering with healing process. Setting desired goal for each stressor or current issue identified.;Long Term Goal: Stressors or current issues are controlled or eliminated.;Short Term goal: Identification and review  with participant of any Quality of Life or Depression concerns found by scoring the questionnaire.;Long Term goal: The participant improves quality of Life and PHQ9 Scores as seen by post scores and/or verbalization of changes       Quality of Life Scores:  Quality of Life - 05/23/19 1424      Quality of Life Scores   Health/Function Pre  20.1 %    Health/Function Post  27.1 %    Health/Function % Change  34.83 %    Socioeconomic Pre  24 %    Socioeconomic Post  26.5 %    Socioeconomic % Change   10.42 %    Psych/Spiritual Pre  24 %    Psych/Spiritual Post  25.5 %    Psych/Spiritual % Change  6.25 %    Family Pre  27.6 %    Family Post  28.8 %    Family % Change  4.35 %    GLOBAL Pre  22.86 %    GLOBAL Post  26.9 %    GLOBAL % Change  17.67 %      Scores of 19 and below usually indicate a  poorer quality of life in these areas.  A difference of  2-3 points is a clinically meaningful difference.  A difference of 2-3 points in the total score of the Quality of Life Index has been associated with significant improvement in overall quality of life, self-image, physical symptoms, and general health in studies assessing change in quality of life.  PHQ-9: Recent Review Flowsheet Data    Depression screen [facility] 2/9 05/23/2019 05/09/2019 03/28/2019   Decreased Interest 0 0 0   Down, Depressed, Hopeless 0 0 0   PHQ - 2 Score 0 0 0   Altered sleeping 0 0 3   Tired, decreased energy 0 0 1   Change in appetite 0 0 2   Feeling bad or failure about yourself  0 0 1   Trouble concentrating 0 0 0   Moving slowly or fidgety/restless 0 0 1   Suicidal thoughts 0 0 0   PHQ-9 Score 0 0 8   Difficult doing work/chores - - Somewhat difficult     Interpretation of Total Score  Total Score Depression Severity:  1-4 = Minimal depression, 5-9 = Mild depression, 10-14 = Moderate depression, 15-19 = Moderately severe depression, 20-27 = Severe depression   Psychosocial Evaluation and  Intervention: Psychosocial Evaluation - 05/16/19 1443      Discharge Psychosocial Assessment & Intervention   Comments  Justin Lin states that he enjoyed the program. He had no idea what he would be able to do when he started the program and is now comfortable with when he exercises at home. He felt comfortable in rehab and was motivated to get healthier. He now does not have to worry and his mind is more at ease when he is at home by himself.       Psychosocial Re-Evaluation: Psychosocial Re-Evaluation    [place] Name 04/18/19 1447             Psychosocial Re-Evaluation   Current issues with  None Identified       Comments  Justin Lin says that he is doing "fantastic"!!  He is feeling good and has a new perspective on life. He says after his heart scare he is now seeing things differently.  Even to the point, that his girlfriend has noted that he does not complain like he used to.  He sleeps well with the help of medication.  His biggest stressor is ongoing COVID issues.       Expected Outcomes  Short: Continue to stay positive.  Long: Continue to have a good outlook on life.       Interventions  Encouraged to attend Cardiac Rehabilitation for the exercise       Continue Psychosocial Services   Follow up required by staff          Psychosocial Discharge (Final Psychosocial Re-Evaluation): Psychosocial Re-Evaluation - 04/18/19 1447      Psychosocial Re-Evaluation   Current issues with  None Identified    Comments  Justin Lin says that he is doing "fantastic"!!  He is feeling good and has a new perspective on life. He says after his heart scare he is now seeing things differently.  Even to the point, that his girlfriend has noted that he does not complain like he used to.  He sleeps well with the help of medication.  His biggest stressor is ongoing COVID issues.    Expected Outcomes  Short: Continue to stay positive.  Long: Continue to have a good outlook on life.  Interventions  Encouraged to attend  Cardiac Rehabilitation for the exercise    Continue Psychosocial Services   Follow up required by staff       Vocational Rehabilitation: Provide vocational rehab assistance to qualifying candidates.   Vocational Rehab Evaluation & Intervention: Vocational Rehab - 03/26/19 1011      Initial Vocational Rehab Evaluation & Intervention   Assessment shows need for Vocational Rehabilitation  No   currently retired      Education: Education Goals: Education classes will be provided on a variety of topics geared toward better understanding of heart health and risk factor modification. Participant will state understanding/return demonstration of topics presented as noted by education test scores.  Learning Barriers/Preferences: Learning Barriers/Preferences - 03/26/19 1012      Learning Barriers/Preferences   Learning Barriers  Sight   glasses   Learning Preferences  None       Education Topics:  AED/CPR: - Group verbal and written instruction with the use of models to demonstrate the basic use of the AED with the basic ABC's of resuscitation.   General Nutrition Guidelines/Fats and Fiber: -Group instruction provided by verbal, written material, models and posters to present the general guidelines for heart healthy nutrition. Gives an explanation and review of dietary fats and fiber.   Cardiac Rehab from 05/09/2019 in [facility] Cardiac and Pulmonary Rehab  Date  04/25/19  Educator  [facility]  Instruction Review Code  1- Verbalizes Understanding      Controlling Sodium/Reading Food Labels: -Group verbal and written material supporting the discussion of sodium use in heart healthy nutrition. Review and explanation with models, verbal and written materials for utilization of the food label.   Exercise Physiology & General Exercise Guidelines: - Group verbal and written instruction with models to review the exercise physiology of the cardiovascular system and associated critical values.  Provides general exercise guidelines with specific guidelines to those with heart or lung disease.    Aerobic Exercise & Resistance Training: - Gives group verbal and written instruction on the various components of exercise. Focuses on aerobic and resistive training programs and the benefits of this training and how to safely progress through these programs..   Cardiac Rehab from 05/09/2019 in [facility] Cardiac and Pulmonary Rehab  Date  04/11/19  Educator  [facility]  Instruction Review Code  1- Verbalizes Understanding      Flexibility, Balance, Mind/Body Relaxation: Provides group verbal/written instruction on the benefits of flexibility and balance training, including mind/body exercise modes such as yoga, pilates and tai chi.  Demonstration and skill practice provided.   Cardiac Rehab from 05/09/2019 in [facility] Cardiac and Pulmonary Rehab  Date  04/25/19 [Balance 12/3]  Educator  [facility]  Instruction Review Code  1- Verbalizes Understanding      Stress and Anxiety: - Provides group verbal and written instruction about the health risks of elevated stress and causes of high stress.  Discuss the correlation between heart/lung disease and anxiety and treatment options. Review healthy ways to manage with stress and anxiety.   Depression: - Provides group verbal and written instruction on the correlation between heart/lung disease and depressed mood, treatment options, and the stigmas associated with seeking treatment.   Anatomy & Physiology of the Heart: - Group verbal and written instruction and models provide basic cardiac anatomy and physiology, with the coronary electrical and arterial systems. Review of Valvular disease and Heart Failure   Cardiac Rehab from 05/09/2019 in [facility] Cardiac and Pulmonary Rehab  Date  04/11/19  Educator  [place]  Instruction Review Code  1- Verbalizes Understanding      Cardiac Procedures: - Group verbal and written instruction to review commonly prescribed medications for  heart disease. Reviews the medication, class of the drug, and side effects. Includes the steps to properly store meds and maintain the prescription regimen. (beta blockers and nitrates)   Cardiac Rehab from 05/09/2019 in [facility] Cardiac and Pulmonary Rehab  Date  04/11/19  Educator  [facility]  Instruction Review Code  1- Verbalizes Understanding      Cardiac Medications I: - Group verbal and written instruction to review commonly prescribed medications for heart disease. Reviews the medication, class of the drug, and side effects. Includes the steps to properly store meds and maintain the prescription regimen.   Cardiac Medications II: -Group verbal and written instruction to review commonly prescribed medications for heart disease. Reviews the medication, class of the drug, and side effects. (all other drug classes)    Go Sex-Intimacy & Heart Disease, Get SMART - Goal Setting: - Group verbal and written instruction through game format to discuss heart disease and the return to sexual intimacy. Provides group verbal and written material to discuss and apply goal setting through the application of the S.M.A.R.T. Method.   Cardiac Rehab from 05/09/2019 in [facility] Cardiac and Pulmonary Rehab  Date  04/11/19  Educator  [facility]  Instruction Review Code  1- Verbalizes Understanding      Other Matters of the Heart: - Provides group verbal, written materials and models to describe Stable Angina and Peripheral Artery. Includes description of the disease process and treatment options available to the cardiac patient.   Exercise & Equipment Safety: - Individual verbal instruction and demonstration of equipment use and safety with use of the equipment.   Cardiac Rehab from 03/28/2019 in [facility] Cardiac and Pulmonary Rehab  Date  03/28/19  Educator  [place]  Instruction Review Code  1- Verbalizes Understanding      Infection Prevention: - Provides verbal and written material to individual with discussion of infection  control including proper hand washing and proper equipment cleaning during exercise session.   Cardiac Rehab from 03/28/2019 in [facility] Cardiac and Pulmonary Rehab  Date  03/28/19  Educator  [place]  Instruction Review Code  1- Verbalizes Understanding      Falls Prevention: - Provides verbal and written material to individual with discussion of falls prevention and safety.   Cardiac Rehab from 03/28/2019 in [facility] Cardiac and Pulmonary Rehab  Date  03/28/19  Educator  [place]  Instruction Review Code  1- Verbalizes Understanding      Diabetes: - Individual verbal and written instruction to review signs/symptoms of diabetes, desired ranges of glucose level fasting, after meals and with exercise. Acknowledge that pre and post exercise glucose checks will be done for 3 sessions at entry of program.   Know Your Numbers and Risk Factors: -Group verbal and written instruction about important numbers in your health.  Discussion of what are risk factors and how they play a role in the disease process.  Review of Cholesterol, Blood Pressure, Diabetes, and BMI and the role they play in your overall health.   Sleep Hygiene: -Provides group verbal and written instruction about how sleep can affect your health.  Define sleep hygiene, discuss sleep cycles and impact of sleep habits. Review good sleep hygiene tips.    Other: -Provides group and verbal instruction on various topics (see comments)   Knowledge Questionnaire Score: Knowledge Questionnaire Score - 05/23/19 1425  Knowledge Questionnaire Score   Post Score  26/26       Core Components/Risk Factors/Patient Goals at Admission: Personal Goals and Risk Factors at Admission - 03/26/19 1048      Core Components/Risk Factors/Patient Goals on Admission    Weight Management  Yes;Obesity;Weight Loss    Intervention  Weight Management: Develop a combined nutrition and exercise program designed to reach desired caloric intake, while maintaining  appropriate intake of nutrient and fiber, sodium and fats, and appropriate energy expenditure required for the weight goal.;Weight Management: Provide education and appropriate resources to help participant work on and attain dietary goals.;Weight Management/Obesity: Establish reasonable short term and long term weight goals.;Obesity: Provide education and appropriate resources to help participant work on and attain dietary goals.    Expected Outcomes  Short Term: Continue to assess and modify interventions until short term weight is achieved;Long Term: Adherence to nutrition and physical activity/exercise program aimed toward attainment of established weight goal;Weight Loss: Understanding of general recommendations for a balanced deficit meal plan, which promotes 1-2 lb weight loss per week and includes a negative energy balance of [phone] kcal/d;Understanding of distribution of calorie intake throughout the day with the consumption of 4-5 meals/snacks;Understanding recommendations for meals to include 15-35% energy as protein, 25-35% energy from fat, 35-60% energy from carbohydrates, less than 266m of dietary cholesterol, 20-35 gm of total fiber daily    Hypertension  Yes    Intervention  Provide education on lifestyle modifcations including regular physical activity/exercise, weight management, moderate sodium restriction and increased consumption of fresh fruit, vegetables, and low fat dairy, alcohol moderation, and smoking cessation.;Monitor prescription use compliance.    Expected Outcomes  Short Term: Continued assessment and intervention until BP is < 140/933mHG in hypertensive participants. < 130/8052mG in hypertensive participants with diabetes, heart failure or chronic kidney disease.;Long Term: Maintenance of blood pressure at goal levels.    Lipids  Yes    Intervention  Provide education and support for participant on nutrition & aerobic/resistive exercise along with prescribed medications to  achieve LDL <83m51mDL >40mg16m Expected Outcomes  Short Term: Participant states understanding of desired cholesterol values and is compliant with medications prescribed. Participant is following exercise prescription and nutrition guidelines.;Long Term: Cholesterol controlled with medications as prescribed, with individualized exercise RX and with personalized nutrition plan. Value goals: LDL < 83mg,32m > 40 mg.       Core Components/Risk Factors/Patient Goals Review:  Goals and Risk Factor Review    Row Name 04/18/19 1449             Core Components/Risk Factors/Patient Goals Review   Personal Goals Review  Weight Management/Obesity;Lipids;Hypertension       Review  Steve Justin Landryeen doing well in rehab.  His weight has been steady and frustrating.  Today we reviewed home exercise and added intervals to boost weight loss.  He has not been checking his blood pressures as he does not have a cuff.  His numbers have been a little high in class, so he was encouraged to get a blood pressure cuff for home use and to record his numbers.       Expected Outcomes  Short: Get blood pressure cuff.  Long: Continue to work on weight loss.          Core Components/Risk Factors/Patient Goals at Discharge (Final Review):  Goals and Risk Factor Review - 04/18/19 1449      Core Components/Risk Factors/Patient Goals Review   Personal  Goals Review  Weight Management/Obesity;Lipids;Hypertension    Review  Justin Lin has been doing well in rehab.  His weight has been steady and frustrating.  Today we reviewed home exercise and added intervals to boost weight loss.  He has not been checking his blood pressures as he does not have a cuff.  His numbers have been a little high in class, so he was encouraged to get a blood pressure cuff for home use and to record his numbers.    Expected Outcomes  Short: Get blood pressure cuff.  Long: Continue to work on weight loss.       ITP Comments: ITP Comments    Row Name  03/26/19 1039 04/01/19 1417 04/10/19 0707 04/23/19 0924 05/08/19 1050   ITP Comments  Virtual orientation completed today.  Documentation for diagonsis can be found in [facility] encounter from 10/15.  Pt is scheduled for EP eval on Thurs 10/22 at 130pm.  First full day of exercise!  Patient was oriented to gym and equipment including functions, settings, policies, and procedures.  Patient's individual exercise prescription and treatment plan were reviewed.  All starting workloads were established based on the results of the 6 minute walk test done at initial orientation visit.  The plan for exercise progression was also introduced and progression will be customized based on patient's performance and goals.  30 day review completed. Continue with ITP sent to Dr. Emily Filbert, Medical Director of Cardiac and Pulmonary Rehab for review , changes as needed and signature.  New to program  Completed Initial RD eval  30 day review competed . ITP sent to Dr Emily Filbert for review, changes as needed and ITP approval signature.   [place] Name 05/23/19 1415           ITP Comments  Jamesrobert graduated today from  rehab with 24 sessions completed.  Details of the patient's exercise prescription and what He needs to do in order to continue the prescription and progress were discussed with patient.  Patient was given a copy of prescription and goals.  Patient verbalized understanding.  Andreas plans to continue to exercise by walking on the treadmill and recumbent bike.          Comments: Discharge ITP

## 2019-05-23 NOTE — Patient Instructions (Signed)
Discharge Patient Instructions  Patient Details  Name: Justin Lin MRN: [id number] Date of Birth: 03-06-1961 Referring Provider:  Isaias Cowman, MD   Number of Visits: 24  Reason for Discharge:  Patient reached a stable level of exercise. Patient independent in their exercise. Patient has met program and personal goals.  Smoking History:  Social History   Tobacco Use  Smoking Status Former Smoker  . Packs/day: 0.25  . Years: 1.00  . Pack years: 0.25  . 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 (HCC)  Status post coronary artery stent placement  Initial Exercise Prescription: Initial Exercise Prescription - 03/28/19 1500      Date of Initial Exercise RX and Referring Provider   Date  03/28/19    Referring Provider  Paraschos      Treadmill   MPH  2.5    Grade  1    Minutes  15    METs  2.7      Recumbant Bike   Level  2    RPM  80    Watts  40    Minutes  15    METs  3      NuStep   Level  3    SPM  80    Minutes  15    METs  3      Elliptical   Level  1    Speed  3.5    Minutes  15    METs  3      REL-XR   Level  2    Watts  40    Speed  50    Minutes  15    METs  3      Prescription Details   Duration  Progress to 30 minutes of continuous aerobic without signs/symptoms of physical distress      Intensity   THRR 40-80% of Max Heartrate  113-145    Ratings of Perceived Exertion  11-15    Perceived Dyspnea  0-4      Progression   Progression  Continue progressive overload as per policy without signs/symptoms or physical distress.      Resistance Training   Training Prescription  Yes    Weight  3    Reps  10-15       Discharge Exercise Prescription (Final Exercise Prescription Changes): Exercise Prescription Changes - 05/16/19 1300      Response to Exercise   Blood  Pressure (Admit)  140/88    Blood Pressure (Exercise)  154/84    Blood Pressure (Exit)  120/78    Heart Rate (Admit)  78 bpm    Heart Rate (Exercise)  107 bpm    Heart Rate (Exit)  80 bpm    Rating of Perceived Exertion (Exercise)  15    Symptoms  none    Duration  Continue with 30 min of aerobic exercise without signs/symptoms of physical distress.    Intensity  THRR unchanged      Progression   Progression  Continue to progress workloads to maintain intensity without signs/symptoms of physical distress.    Average METs  4.82      Resistance Training   Training Prescription  Yes    Weight  7 lb    Reps  10-15      Interval Training  Interval Training  Yes    Equipment  Treadmill;Recumbant Bike;Elliptical    Comments  30 sec on and 1 min off      Treadmill   MPH  2.9    Grade  4   to 12 on intervals   Minutes  15    METs  4.82      Elliptical   Level  12    Speed  4.6    Minutes  15      T5 Nustep   Level  5    SPM  80    Minutes  15    METs  4.5      Home Exercise Plan   Plans to continue exercise at  Home (comment)   walking, treadmill, recumbent bike   Frequency  Add 2 additional days to program exercise sessions.    Initial Home Exercises Provided  04/18/19       Functional Capacity: 6 Minute Walk    Row Name 03/28/19 1457 05/16/19 1436       6 Minute Walk   Phase  Initial  Discharge    Distance  1387 feet  1810 feet    Distance % Change  --  30.5 %    Distance Feet Change  --  423 ft    Walk Time  6 minutes  6 minutes    # of Rest Breaks  --  0    MPH  2.62  3.43    METS  2.77  4.35    RPE  12  15    VO2 Peak  9.71  15.22    Symptoms  No  No    Resting HR  82 bpm  73 bpm    Resting BP  124/80  134/72    Max Ex. HR  112 bpm  124 bpm    Max Ex. BP  120/90  164/80    2 Minute Post BP  118/82  --       Quality of Life: Quality of Life - 03/28/19 1412      Quality of Life   Select  Quality of Life      Quality of Life Scores    Health/Function Pre  20.1 %    Socioeconomic Pre  24 %    Psych/Spiritual Pre  24 %    Family Pre  27.6 %    GLOBAL Pre  22.86 %       Personal Goals: Goals established at orientation with interventions provided to work toward goal. Personal Goals and Risk Factors at Admission - 03/26/19 1048      Core Components/Risk Factors/Patient Goals on Admission    Weight Management  Yes;Obesity;Weight Loss    Intervention  Weight Management: Develop a combined nutrition and exercise program designed to reach desired caloric intake, while maintaining appropriate intake of nutrient and fiber, sodium and fats, and appropriate energy expenditure required for the weight goal.;Weight Management: Provide education and appropriate resources to help participant work on and attain dietary goals.;Weight Management/Obesity: Establish reasonable short term and long term weight goals.;Obesity: Provide education and appropriate resources to help participant work on and attain dietary goals.    Expected Outcomes  Short Term: Continue to assess and modify interventions until short term weight is achieved;Long Term: Adherence to nutrition and physical activity/exercise program aimed toward attainment of established weight goal;Weight Loss: Understanding of general recommendations for a balanced deficit meal plan, which promotes 1-2 lb weight loss per week and  includes a negative energy balance of [phone] kcal/d;Understanding of distribution of calorie intake throughout the day with the consumption of 4-5 meals/snacks;Understanding recommendations for meals to include 15-35% energy as protein, 25-35% energy from fat, 35-60% energy from carbohydrates, less than 241m of dietary cholesterol, 20-35 gm of total fiber daily    Hypertension  Yes    Intervention  Provide education on lifestyle modifcations including regular physical activity/exercise, weight management, moderate sodium restriction and increased consumption of fresh  fruit, vegetables, and low fat dairy, alcohol moderation, and smoking cessation.;Monitor prescription use compliance.    Expected Outcomes  Short Term: Continued assessment and intervention until BP is < 140/964mHG in hypertensive participants. < 130/8078mG in hypertensive participants with diabetes, heart failure or chronic kidney disease.;Long Term: Maintenance of blood pressure at goal levels.    Lipids  Yes    Intervention  Provide education and support for participant on nutrition & aerobic/resistive exercise along with prescribed medications to achieve LDL <1m17mDL >40mg45m Expected Outcomes  Short Term: Participant states understanding of desired cholesterol values and is compliant with medications prescribed. Participant is following exercise prescription and nutrition guidelines.;Long Term: Cholesterol controlled with medications as prescribed, with individualized exercise RX and with personalized nutrition plan. Value goals: LDL < 1mg,14m > 40 mg.        Personal Goals Discharge: Goals and Risk Factor Review - 04/18/19 1449      Core Components/Risk Factors/Patient Goals Review   Personal Goals Review  Weight Management/Obesity;Lipids;Hypertension    Review  Justin Lin doing well in rehab.  His weight has been steady and frustrating.  Today we reviewed home exercise and added intervals to boost weight loss.  He has not been checking his blood pressures as he does not have a cuff.  His numbers have been a little high in class, so he was encouraged to get a blood pressure cuff for home use and to record his numbers.    Expected Outcomes  Short: Get blood pressure cuff.  Long: Continue to work on weight loss.       Exercise Goals and Review: Exercise Goals    Row Name 03/28/19 1411             Exercise Goals   Increase Physical Activity  Yes       Intervention  Provide advice, education, support and counseling about physical activity/exercise needs.;Develop an  individualized exercise prescription for aerobic and resistive training based on initial evaluation findings, risk stratification, comorbidities and participant's personal goals.       Expected Outcomes  Short Term: Attend rehab on a regular basis to increase amount of physical activity.;Long Term: Add in home exercise to make exercise part of routine and to increase amount of physical activity.;Long Term: Exercising regularly at least 3-5 days a week.       Increase Strength and Stamina  Yes       Intervention  Provide advice, education, support and counseling about physical activity/exercise needs.;Develop an individualized exercise prescription for aerobic and resistive training based on initial evaluation findings, risk stratification, comorbidities and participant's personal goals.       Expected Outcomes  Short Term: Increase workloads from initial exercise prescription for resistance, speed, and METs.;Short Term: Perform resistance training exercises routinely during rehab and add in resistance training at home;Long Term: Improve cardiorespiratory fitness, muscular endurance and strength as measured by increased METs and functional capacity (6MWT)       Able to  understand and use rate of perceived exertion (RPE) scale  Yes       Intervention  Provide education and explanation on how to use RPE scale       Expected Outcomes  Short Term: Able to use RPE daily in rehab to express subjective intensity level;Long Term:  Able to use RPE to guide intensity level when exercising independently       Able to understand and use Dyspnea scale  Yes       Intervention  Provide education and explanation on how to use Dyspnea scale       Expected Outcomes  Short Term: Able to use Dyspnea scale daily in rehab to express subjective sense of shortness of breath during exertion;Long Term: Able to use Dyspnea scale to guide intensity level when exercising independently       Knowledge and understanding of Target Heart  Rate Range (THRR)  Yes       Intervention  Provide education and explanation of THRR including how the numbers were predicted and where they are located for reference       Expected Outcomes  Short Term: Able to state/look up THRR;Short Term: Able to use daily as guideline for intensity in rehab;Long Term: Able to use THRR to govern intensity when exercising independently       Able to check pulse independently  Yes       Intervention  Provide education and demonstration on how to check pulse in carotid and radial arteries.;Review the importance of being able to check your own pulse for safety during independent exercise       Expected Outcomes  Short Term: Able to explain why pulse checking is important during independent exercise;Long Term: Able to check pulse independently and accurately       Understanding of Exercise Prescription  Yes       Intervention  Provide education, explanation, and written materials on patient's individual exercise prescription       Expected Outcomes  Short Term: Able to explain program exercise prescription;Long Term: Able to explain home exercise prescription to exercise independently          Exercise Goals Re-Evaluation: Exercise Goals Re-Evaluation    Row Name 04/01/19 1418 04/17/19 1339 04/18/19 1442 04/29/19 1341 05/16/19 1340     Exercise Goal Re-Evaluation   Exercise Goals Review  Able to understand and use rate of perceived exertion (RPE) scale;Increase Physical Activity;Knowledge and understanding of Target Heart Rate Range (THRR);Understanding of Exercise Prescription;Able to check pulse independently;Increase Strength and Stamina  Increase Physical Activity;Increase Strength and Stamina;Understanding of Exercise Prescription  Increase Physical Activity;Increase Strength and Stamina;Understanding of Exercise Prescription  Increase Physical Activity;Increase Strength and Stamina;Understanding of Exercise Prescription  Increase Strength and Stamina;Able to  understand and use rate of perceived exertion (RPE) scale;Able to understand and use Dyspnea scale;Knowledge and understanding of Target Heart Rate Range (THRR);Able to check pulse independently;Understanding of Exercise Prescription   Comments  Reviewed RPE scale, THR and program prescription with pt today.  Pt voiced understanding and was given a copy of goals to take home.  Justin Lin is off to a great start in rehab.  He doing well on the elliptical and up to level 7 on XR.  We will continue to monitor his progress.  Justin Lin is doing well. He is feeling stronger and started intervals today. Reviewed home exercise with pt today.  Pt plans to walk and use treadmill and bike at home for exercise.  He also has weight  equipment at home.  Reviewed THR, pulse, RPE, sign and symptoms, NTG use, and when to call 911 or MD.  Also discussed weather considerations and indoor options.  Pt voiced understanding.  Justin Lin is doing well in rehab.  He is up to level 117 watts on the recumbent bike.  We will continue to monitor his progress.  Justin Lin plans to [place] early from [facility].  He is having some issues with care for his son.  He has progressed well and has added intervals to his program.   Expected Outcomes  Short: Use RPE daily to regulate intensity. Long: Follow program prescription in THR.  Short: Review home exercise guidelines.  Long: Continue to improve stamina.  Short: Start add exercise in at home.  Long: Continue to improve stamina.  Short: Continue to use intervals.  Long: Continue to exercise more at home.  Short:  complete HT sessions Long - miantain exerciseon his own      Nutrition & Weight - Outcomes:    Nutrition: Nutrition Therapy & Goals - 04/23/19 0900      Nutrition Therapy   Diet  HH, low Na    Protein (specify units)  110g    Fiber  30 grams    Whole Grain Foods  3 servings    Saturated Fats  12 max. grams    Fruits and Vegetables  5 servings/day    Sodium  1.5 grams      Personal Nutrition  Goals   Nutrition Goal  ST: continue HH eating LT: Lose weight (goal wt), eat healthy and be healthy    Comments  B: oatmeal w/ banana L: grilled chicken in a salad, tilapia with some vegetables D: chicken 3-4x/week, trying to limit red meat, eat more salads, more fruits (pineapples and strawberries), limiting sweets (when he does eat sweets he makes them). Snacks mixed unsalted peanuts. Discussed HH eating. Pt reports honoring hunger and feeling good. Pt made many diet changes since leaving the hospital, will continue to monitor progress.      Intervention Plan   Intervention  Prescribe, educate and counsel regarding individualized specific dietary modifications aiming towards targeted core components such as weight, hypertension, lipid management, diabetes, heart failure and other comorbidities.;Nutrition handout(s) given to patient.    Expected Outcomes  Short Term Goal: Understand basic principles of dietary content, such as calories, fat, sodium, cholesterol and nutrients.;Short Term Goal: A plan has been developed with personal nutrition goals set during dietitian appointment.;Long Term Goal: Adherence to prescribed nutrition plan.       Nutrition Discharge:   Education Questionnaire Score: Knowledge Questionnaire Score - 03/28/19 1413      Knowledge Questionnaire Score   Pre Score  24/26 exercise and nutrition       Goals reviewed with patient; copy given to patient.

## 2019-06-21 ENCOUNTER — Other Ambulatory Visit: Payer: Self-pay | Admitting: Urology

## 2019-06-21 DIAGNOSIS — R109 Unspecified abdominal pain: Secondary | ICD-10-CM

## 2019-07-03 ENCOUNTER — Other Ambulatory Visit: Payer: Self-pay

## 2019-07-03 ENCOUNTER — Ambulatory Visit
Admission: RE | Admit: 2019-07-03 | Discharge: 2019-07-03 | Disposition: A | Discharge status: Home / Self Care | Payer: BC Managed Care – PPO | Source: Ambulatory Visit | Attending: Urology | Admitting: Urology

## 2019-07-03 DIAGNOSIS — R109 Unspecified abdominal pain: Secondary | ICD-10-CM | POA: Insufficient documentation

## 2019-07-03 HISTORY — DX: Essential (primary) hypertension: I10

## 2019-07-03 LAB — POCT I-STAT CREATININE: Creatinine, Ser: 1 mg/dL (ref 0.61–1.24)

## 2019-07-03 MED ORDER — IOHEXOL 350 MG/ML SOLN
100.0000 mL | Freq: Once | INTRAVENOUS | Status: AC | PRN
  Administered 2019-07-03: 100 mL via INTRAVENOUS

## 2019-07-12 ENCOUNTER — Emergency Department
Admission: EM | Admit: 2019-07-12 | Discharge: 2019-07-12 | Disposition: A | Discharge status: Home / Self Care | Payer: BC Managed Care – PPO | Attending: Student | Admitting: Student

## 2019-07-12 ENCOUNTER — Emergency Department: Payer: BC Managed Care – PPO

## 2019-07-12 ENCOUNTER — Other Ambulatory Visit: Payer: Self-pay

## 2019-07-12 ENCOUNTER — Encounter: Payer: Self-pay | Admitting: Emergency Medicine

## 2019-07-12 DIAGNOSIS — Z79899 Other long term (current) drug therapy: Secondary | ICD-10-CM | POA: Diagnosis not present

## 2019-07-12 DIAGNOSIS — I10 Essential (primary) hypertension: Secondary | ICD-10-CM | POA: Insufficient documentation

## 2019-07-12 DIAGNOSIS — R1032 Left lower quadrant pain: Secondary | ICD-10-CM | POA: Insufficient documentation

## 2019-07-12 DIAGNOSIS — R109 Unspecified abdominal pain: Secondary | ICD-10-CM

## 2019-07-12 DIAGNOSIS — Z7982 Long term (current) use of aspirin: Secondary | ICD-10-CM | POA: Insufficient documentation

## 2019-07-12 DIAGNOSIS — Z87891 Personal history of nicotine dependence: Secondary | ICD-10-CM | POA: Diagnosis not present

## 2019-07-12 LAB — COMPREHENSIVE METABOLIC PANEL
ALT: 41 U/L (ref 0–44)
AST: 31 U/L (ref 15–41)
Albumin: 4 g/dL (ref 3.5–5.0)
Alkaline Phosphatase: 116 U/L (ref 38–126)
Anion gap: 11 (ref 5–15)
BUN: 17 mg/dL (ref 6–20)
CO2: 19 mmol/L — ABNORMAL LOW (ref 22–32)
Calcium: 8.9 mg/dL (ref 8.9–10.3)
Chloride: 106 mmol/L (ref 98–111)
Creatinine, Ser: 1.06 mg/dL (ref 0.61–1.24)
GFR calc Af Amer: >60 mL/min (ref >60)
GFR calc non Af Amer: >60 mL/min (ref >60)
Glucose, Bld: 176 mg/dL — ABNORMAL HIGH (ref 70–99)
Potassium: 3.6 mmol/L (ref 3.5–5.1)
Sodium: 136 mmol/L (ref 135–145)
Total Bilirubin: 0.6 mg/dL (ref 0.3–1.2)
Total Protein: 7.2 g/dL (ref 6.5–8.1)

## 2019-07-12 LAB — CBC WITH DIFFERENTIAL/PLATELET
Abs Immature Granulocytes: 0.03 10*3/uL (ref 0.00–0.07)
Basophils Absolute: 0 10*3/uL (ref 0.0–0.1)
Basophils Relative: 1 %
Eosinophils Absolute: 0.2 10*3/uL (ref 0.0–0.5)
Eosinophils Relative: 2 %
HCT: 44.8 % (ref 39.0–52.0)
Hemoglobin: 15.5 g/dL (ref 13.0–17.0)
Immature Granulocytes: 0 %
Lymphocytes Relative: 30 %
Lymphs Abs: 2.5 10*3/uL (ref 0.7–4.0)
MCH: 30.1 pg (ref 26.0–34.0)
MCHC: 34.6 g/dL (ref 30.0–36.0)
MCV: 87 fL (ref 80.0–100.0)
Monocytes Absolute: 0.8 10*3/uL (ref 0.1–1.0)
Monocytes Relative: 10 %
Neutro Abs: 5 10*3/uL (ref 1.7–7.7)
Neutrophils Relative %: 57 %
Platelets: 188 10*3/uL (ref 150–400)
RBC: 5.15 MIL/uL (ref 4.22–5.81)
RDW: 13.6 % (ref 11.5–15.5)
WBC: 8.6 10*3/uL (ref 4.0–10.5)
nRBC: 0 % (ref 0.0–0.2)

## 2019-07-12 LAB — URINALYSIS, COMPLETE (UACMP) WITH MICROSCOPIC
Bacteria, UA: NONE SEEN
Bilirubin Urine: NEGATIVE
Glucose, UA: NEGATIVE mg/dL
Hgb urine dipstick: NEGATIVE
Ketones, ur: NEGATIVE mg/dL
Nitrite: NEGATIVE
Protein, ur: NEGATIVE mg/dL
Specific Gravity, Urine: 1.026 (ref 1.005–1.030)
pH: 5 (ref 5.0–8.0)

## 2019-07-12 LAB — LIPASE, BLOOD: Lipase: 28 U/L (ref 11–51)

## 2019-07-12 MED ORDER — IBUPROFEN 600 MG PO TABS: 600.0000 mg | ORAL_TABLET | Freq: Four times a day (QID) | ORAL | 0 refills | Status: AC | PRN

## 2019-07-12 MED ORDER — OXYCODONE HCL 5 MG PO TABS
5.0000 mg | ORAL_TABLET | Freq: Once | ORAL | Status: AC
  Administered 2019-07-12: 5 mg via ORAL
  Filled 2019-07-12: qty 1

## 2019-07-12 MED ORDER — KETOROLAC TROMETHAMINE 15 MG/ML IJ SOLN
15.0000 mg | Freq: Once | INTRAMUSCULAR | Status: AC
  Administered 2019-07-12: 15 mg via INTRAVENOUS
  Filled 2019-07-12: qty 1

## 2019-07-12 MED ORDER — IOHEXOL 300 MG/ML  SOLN
125.0000 mL | Freq: Once | INTRAMUSCULAR | Status: AC | PRN
  Administered 2019-07-12: 125 mL via INTRAVENOUS

## 2019-07-12 MED ORDER — OXYCODONE HCL 5 MG PO TABS: 5.0000 mg | ORAL_TABLET | Freq: Four times a day (QID) | ORAL | 0 refills | Status: AC | PRN

## 2019-07-12 NOTE — Discharge Instructions (Addendum)
Thank you for letting us take care of you in the emergency department today.   Please continue to take any regular, prescribed medications.   New medications we have prescribed:  -Ibuprofen: You can take 600 mg every 6 hours as needed for pain, discomfort -Oxycodone: Take as needed, as directed for pain that is not well controlled with the ibuprofen.  Do not drink, drive, or operate heavy machinery while on this medication.  Please follow up with: - Your primary care doctor to review your ER visit and follow up on your symptoms.   Please return to the ER for any new or worsening symptoms.

## 2019-07-12 NOTE — ED Triage Notes (Signed)
Patient ambulatory to triage with steady gait, without difficulty or distress noted, mask in place; pt reports left lower abd/side pain accomp by nausea; st hx kidney stones; recent CT scan for same c/o

## 2019-07-12 NOTE — ED Provider Notes (Signed)
[facility] Emergency Department Provider Note  ____________________________________________   First MD Initiated Contact with Patient 07/12/19 [phone]     (approximate)  I have reviewed the triage vital signs and the nursing notes.  History  Chief Complaint Abdominal Pain    HPI Justin Lin is a 59 y.o. male with history of CAD, nephrolithiasis who presents to the emergency department for ongoing issues with left mid and lower abdominal pain.  Patient states symptoms have been ongoing for several weeks now.  He was initially seen as an outpatient by his urologist who ruled out a stone.  Later seen by PMD, with CT imaging findings on (1/27) consistent with epiploic appendagitis in the left anterior pelvis, he was initiated on antibiotics.  He reports compliance with these medications, but no significant improvement in his symptoms.  Pain is a dull aching, pressure type sensation.  Seems to be there constantly in the background with intermittent waves of increased severity.  Last night he had a sharp increase in severity, 10/10, and worse than he has been experiencing over the last several weeks, which is what prompted evaluation.  Pain seems to sit in the left mid abdomen area and occasionally radiates down into the groin and sometimes up into the flank.  No alleviating or aggravating components.  Denies any dysuria, but feels almost a pressure-like sensation in the mid abdomen with urination.  No hematuria.  No fevers, nausea, vomiting.  Normal bowel movement yesterday.  No history of intra-abdominal surgery.   Past Medical Hx Past Medical History:  Diagnosis Date  . Hypertension   . Kidney stone     Problem List Patient Active Problem List   Diagnosis Date Noted  . Acute ST elevation myocardial infarction (STEMI) involving left anterior descending (LAD) coronary artery (HCC) 03/21/2019  . STEMI (ST elevation myocardial infarction) (HCC) 03/21/2019     Past Surgical Hx Past Surgical History:  Procedure Laterality Date  . CORONARY/GRAFT ACUTE MI REVASCULARIZATION N/A 03/21/2019   Procedure: Coronary/Graft Acute MI Revascularization;  Surgeon: Marcina Millard, MD;  Location: [facility] INVASIVE CV LAB;  Service: Cardiovascular;  Laterality: N/A;  . LEFT HEART CATH AND CORONARY ANGIOGRAPHY N/A 03/21/2019   Procedure: LEFT HEART CATH AND CORONARY ANGIOGRAPHY;  Surgeon: Marcina Millard, MD;  Location: [facility] INVASIVE CV LAB;  Service: Cardiovascular;  Laterality: N/A;    Medications Prior to Admission medications   Medication Sig Start Date End Date Taking? Authorizing Provider  acetaminophen (TYLENOL) 325 MG tablet Take 2 tablets (650 mg total) by mouth every 4 (four) hours as needed for headache or mild pain. 03/23/19   Ramonita Lab, MD  amiodarone (PACERONE) 200 MG tablet Take 2 tablets (400 mg total) by mouth 2 (two) times daily. Take 400 mg twice a day for 1 week followed by 200 mg twice a day 03/23/19   Ramonita Lab, MD  aspirin EC 81 MG tablet Take 1 tablet (81 mg total) by mouth daily. 03/23/19 03/22/20  Ramonita Lab, MD  atorvastatin (LIPITOR) 80 MG tablet Take 1 tablet (80 mg total) by mouth daily at 6 PM. 03/23/19   Gouru, Aruna, MD  lisinopril (ZESTRIL) 2.5 MG tablet Take 1 tablet (2.5 mg total) by mouth daily. 03/24/19   Ramonita Lab, MD  loratadine (CLARITIN) 10 MG tablet Take 10 mg by mouth daily as needed for allergies.    [provider]  metoprolol succinate (TOPROL-XL) 50 MG 24 hr tablet Take 1 tablet (50 mg total) by mouth daily. Take  with or immediately following a meal. 03/24/19   Gouru, Illene Silver, MD  nitroGLYCERIN (NITROSTAT) 0.4 MG SL tablet Place 1 tablet (0.4 mg total) under the tongue every 5 (five) minutes x 3 doses as needed for chest pain. 03/23/19   Nicholes Mango, MD  ticagrelor (BRILINTA) 90 MG TABS tablet Take 1 tablet (90 mg total) by mouth 2 (two) times daily. 03/23/19   Nicholes Mango, MD     Allergies Patient has no known allergies.  Family Hx No family history on file.  Social Hx Social History   Tobacco Use  . Smoking status: Former Smoker    Packs/day: 0.25    Years: 1.00    Pack years: 0.25    Types: Cigarettes  . Smokeless tobacco: Never Used  . Tobacco comment: 22-25 year old, hasn't smoked in 40 years  Substance Use Topics  . Alcohol use: Not Currently  . Drug use: Not on file     Review of Systems  Constitutional: Negative for fever, chills. Eyes: Negative for visual changes. ENT: Negative for sore throat. Cardiovascular: Negative for chest pain. Respiratory: Negative for shortness of breath. Gastrointestinal: Negative for nausea, vomiting. + abdominal pain Genitourinary: Negative for dysuria. Musculoskeletal: Negative for leg swelling. Skin: Negative for rash. Neurological: Negative for headaches.   Physical Exam  Vital Signs: ED Triage Vitals  Enc Vitals Group     BP 07/12/19 0117 (!) 150/100     Pulse Rate 07/12/19 0117 88     Resp 07/12/19 0117 19     Temp 07/12/19 0117 97.7 F (36.5 C)     Temp Source 07/12/19 0530 Oral     SpO2 07/12/19 0117 95 %     Weight 07/12/19 0105 295 lb (133.8 kg)     Height 07/12/19 0105 6\' 2"  (1.88 m)     Head Circumference --      Peak Flow --      Pain Score 07/12/19 0105 6     Pain Loc --      Pain Edu? --      Excl. in [place]? --     Constitutional: Alert and oriented.  Head: Normocephalic. Atraumatic. Eyes: Conjunctivae clear. Sclera anicteric. Nose: No congestion. No rhinorrhea. Mouth/Throat: Wearing mask.  Neck: No stridor.   Cardiovascular: Normal rate, regular rhythm. Extremities well perfused. Respiratory: Normal respiratory effort.  Lungs CTAB. Gastrointestinal: Soft. Left mid and lower abdominal tenderness. No rebound or guarding. No appreciable hernias on exam.  Musculoskeletal: No lower extremity edema. No deformities. Neurologic:  Normal speech and language. No gross focal  neurologic deficits are appreciated.  Skin: Skin is warm, dry and intact. No rash noted. Psychiatric: Mood and affect are appropriate for situation.    Radiology  CT: IMPRESSION:  1. Stable findings consistent with epiploic appendagitis seen in the  left lower quadrant near the junction of the descending and sigmoid  colon.  2. Sigmoid diverticulosis is noted without inflammation.  3. Minimal bilateral nonobstructive nephrolithiasis. No  hydronephrosis or renal obstruction is noted.    Procedures  Procedure(s) performed (including critical care):  Procedures   Initial Impression / Assessment and Plan / ED Course  59 y.o. male who presents to the ED for left mid and lower abdominal pain.  Ddx: nephrolithiasis, diverticulitis, colitis  Will evaluate with labs, imaging.  Labs without actionable derangements.  CT scan with stable findings consistent with epiploic appendagitis, no other acute findings.  Updated patient on results.  Advised continued supportive care with ibuprofen and continuing  his other prescribed medications.  Will provide short Rx for oxycodone for breakthrough pain.  Advised outpatient follow-up and given return precautions.  Patient voices understanding and is comfortable to plan and discharge.   Final Clinical Impression(s) / ED Diagnosis  Final diagnoses:  Left sided abdominal pain       Note:  This document was prepared using Dragon voice recognition software and may include unintentional dictation errors.   Miguel Aschoff., MD 07/12/19 [phone]

## 2020-03-14 ENCOUNTER — Encounter: Payer: Self-pay | Admitting: Emergency Medicine

## 2020-03-14 ENCOUNTER — Emergency Department
Admission: EM | Admit: 2020-03-14 | Discharge: 2020-03-14 | Disposition: A | Discharge status: Home / Self Care | Payer: BC Managed Care – PPO | Attending: Emergency Medicine | Admitting: Emergency Medicine

## 2020-03-14 ENCOUNTER — Emergency Department: Payer: BC Managed Care – PPO

## 2020-03-14 ENCOUNTER — Other Ambulatory Visit: Payer: Self-pay

## 2020-03-14 DIAGNOSIS — Z79899 Other long term (current) drug therapy: Secondary | ICD-10-CM | POA: Diagnosis not present

## 2020-03-14 DIAGNOSIS — Z87891 Personal history of nicotine dependence: Secondary | ICD-10-CM | POA: Diagnosis not present

## 2020-03-14 DIAGNOSIS — I119 Hypertensive heart disease without heart failure: Secondary | ICD-10-CM | POA: Insufficient documentation

## 2020-03-14 DIAGNOSIS — Z7982 Long term (current) use of aspirin: Secondary | ICD-10-CM | POA: Diagnosis not present

## 2020-03-14 DIAGNOSIS — R079 Chest pain, unspecified: Secondary | ICD-10-CM | POA: Diagnosis present

## 2020-03-14 DIAGNOSIS — M542 Cervicalgia: Secondary | ICD-10-CM | POA: Diagnosis not present

## 2020-03-14 HISTORY — DX: Atherosclerotic heart disease of native coronary artery without angina pectoris: I25.10

## 2020-03-14 LAB — CBC
HCT: 46.6 % (ref 39.0–52.0)
Hemoglobin: 16.2 g/dL (ref 13.0–17.0)
MCH: 29.6 pg (ref 26.0–34.0)
MCHC: 34.8 g/dL (ref 30.0–36.0)
MCV: 85 fL (ref 80.0–100.0)
Platelets: 226 10*3/uL (ref 150–400)
RBC: 5.48 MIL/uL (ref 4.22–5.81)
RDW: 13.1 % (ref 11.5–15.5)
WBC: 8 10*3/uL (ref 4.0–10.5)
nRBC: 0 % (ref 0.0–0.2)

## 2020-03-14 LAB — BASIC METABOLIC PANEL
Anion gap: 8 (ref 5–15)
BUN: 19 mg/dL (ref 6–20)
CO2: 24 mmol/L (ref 22–32)
Calcium: 9.1 mg/dL (ref 8.9–10.3)
Chloride: 103 mmol/L (ref 98–111)
Creatinine, Ser: 1.17 mg/dL (ref 0.61–1.24)
GFR, Estimated: >60 mL/min (ref >60)
Glucose, Bld: 156 mg/dL — ABNORMAL HIGH (ref 70–99)
Potassium: 4.1 mmol/L (ref 3.5–5.1)
Sodium: 135 mmol/L (ref 135–145)

## 2020-03-14 LAB — TROPONIN I (HIGH SENSITIVITY)
Troponin I (High Sensitivity): 3 ng/L (ref <18)
Troponin I (High Sensitivity): 6 ng/L (ref <18)

## 2020-03-14 MED ORDER — ALUM & MAG HYDROXIDE-SIMETH 200-200-20 MG/5ML PO SUSP
30.0000 mL | Freq: Once | ORAL | Status: AC
  Administered 2020-03-14: 30 mL via ORAL
  Filled 2020-03-14: qty 30

## 2020-03-14 MED ORDER — LIDOCAINE VISCOUS HCL 2 % MT SOLN
15.0000 mL | Freq: Once | OROMUCOSAL | Status: AC
  Administered 2020-03-14: 15 mL via ORAL
  Filled 2020-03-14: qty 15

## 2020-03-14 MED ORDER — FAMOTIDINE 20 MG PO TABS: 20.0000 mg | ORAL_TABLET | Freq: Every day | ORAL | 1 refills | Status: AC

## 2020-03-14 NOTE — ED Notes (Signed)
ED Provider at bedside. 

## 2020-03-14 NOTE — ED Provider Notes (Signed)
[facility] Emergency Department Provider Note   ____________________________________________   I have reviewed the triage vital signs and the nursing notes.   HISTORY  Chief Complaint Chest Pain   History limited by: Not Limited   HPI Moody Robben is a 59 y.o. male who presents to the emergency department today because of concerns for chest pain.  Patient states he had an episode of this 4 days ago.  He was helping his brother remodel his house.  He states however that he was not doing any strenuous activity.  He then had a sudden onset of chest pressure which radiated up into his neck on the right side, shortness of breath and dizziness.  Felt like he might pass out.  The symptoms then resolved.  However today he started having similar symptoms as he was watching TV.  At the time my exam he no longer complains of any significant lightheadedness or shortness of breath but continues to have right-sided pain with radiation up into his right neck.  He has noticed that his blood pressure has been elevated which is unusual for him since he has been put on blood pressure medications.  Patient denies any swelling.  Denies any fevers.  Records reviewed. Per medical record review patient has a history of CAD, HTN.   Past Medical History:  Diagnosis Date  . Coronary artery disease   . Hypertension   . Kidney stone     Patient Active Problem List   Diagnosis Date Noted  . Acute ST elevation myocardial infarction (STEMI) involving left anterior descending (LAD) coronary artery (HCC) 03/21/2019  . STEMI (ST elevation myocardial infarction) (HCC) 03/21/2019    Past Surgical History:  Procedure Laterality Date  . CORONARY/GRAFT ACUTE MI REVASCULARIZATION N/A 03/21/2019   Procedure: Coronary/Graft Acute MI Revascularization;  Surgeon: Marcina Millard, MD;  Location: [facility] INVASIVE CV LAB;  Service: Cardiovascular;  Laterality: N/A;  . LEFT HEART CATH AND  CORONARY ANGIOGRAPHY N/A 03/21/2019   Procedure: LEFT HEART CATH AND CORONARY ANGIOGRAPHY;  Surgeon: Marcina Millard, MD;  Location: [facility] INVASIVE CV LAB;  Service: Cardiovascular;  Laterality: N/A;    Prior to Admission medications   Medication Sig Start Date End Date Taking? Authorizing Provider  acetaminophen (TYLENOL) 325 MG tablet Take 2 tablets (650 mg total) by mouth every 4 (four) hours as needed for headache or mild pain. 03/23/19   Ramonita Lab, MD  amiodarone (PACERONE) 200 MG tablet Take 2 tablets (400 mg total) by mouth 2 (two) times daily. Take 400 mg twice a day for 1 week followed by 200 mg twice a day 03/23/19   Ramonita Lab, MD  aspirin EC 81 MG tablet Take 1 tablet (81 mg total) by mouth daily. 03/23/19 03/22/20  Ramonita Lab, MD  atorvastatin (LIPITOR) 80 MG tablet Take 1 tablet (80 mg total) by mouth daily at 6 PM. 03/23/19   Gouru, Aruna, MD  lisinopril (ZESTRIL) 2.5 MG tablet Take 1 tablet (2.5 mg total) by mouth daily. 03/24/19   Ramonita Lab, MD  loratadine (CLARITIN) 10 MG tablet Take 10 mg by mouth daily as needed for allergies.    [provider]  metoprolol succinate (TOPROL-XL) 50 MG 24 hr tablet Take 1 tablet (50 mg total) by mouth daily. Take with or immediately following a meal. 03/24/19   Gouru, Deanna Artis, MD  nitroGLYCERIN (NITROSTAT) 0.4 MG SL tablet Place 1 tablet (0.4 mg total) under the tongue every 5 (five) minutes x 3 doses as needed for chest  pain. 03/23/19   Gouru, Deanna Artis, MD  ticagrelor (BRILINTA) 90 MG TABS tablet Take 1 tablet (90 mg total) by mouth 2 (two) times daily. 03/23/19   Ramonita Lab, MD    Allergies Patient has no known allergies.  No family history on file.  Social History Social History   Tobacco Use  . Smoking status: Former Smoker    Packs/day: 0.25    Years: 1.00    Pack years: 0.25    Types: Cigarettes  . Smokeless tobacco: Never Used  . Tobacco comment: 50-72 year old, hasn't smoked in 40 years  Substance Use  Topics  . Alcohol use: Not Currently  . Drug use: Not on file    Review of Systems Constitutional: No fever/chills Eyes: No visual changes. ENT: No sore throat. Cardiovascular: Positive for chest pain. Respiratory: Positive for shortness of breath. Gastrointestinal: No abdominal pain.  No nausea, no vomiting.  No diarrhea.   Genitourinary: Negative for dysuria. Musculoskeletal: Negative for back pain. Skin: Negative for rash. Neurological: Positive for dizziness.  ____________________________________________   PHYSICAL EXAM:  VITAL SIGNS: ED Triage Vitals  Enc Vitals Group     BP 03/14/20 1636 (!) 132/99     Pulse Rate 03/14/20 1636 (!) 104     Resp 03/14/20 1636 16     Temp 03/14/20 1636 98 F (36.7 C)     Temp src --      SpO2 03/14/20 1636 98 %     Weight 03/14/20 1631 (!) 305 lb (138.3 kg)     Height 03/14/20 1631 6\' 2"  (1.88 m)     Head Circumference --      Peak Flow --      Pain Score 03/14/20 1631 0   Constitutional: Alert and oriented.  Eyes: Conjunctivae are normal.  ENT      Head: Normocephalic and atraumatic.      Nose: No congestion/rhinnorhea.      Mouth/Throat: Mucous membranes are moist.      Neck: No stridor. Hematological/Lymphatic/Immunilogical: No cervical lymphadenopathy. Cardiovascular: Normal rate, regular rhythm.  No murmurs, rubs, or gallops.  Respiratory: Normal respiratory effort without tachypnea nor retractions. Breath sounds are clear and equal bilaterally. No wheezes/rales/rhonchi. Gastrointestinal: Soft and non tender. No rebound. No guarding.  Genitourinary: Deferred Musculoskeletal: Normal range of motion in all extremities. No lower extremity edema. Neurologic:  Normal speech and language. No gross focal neurologic deficits are appreciated.  Skin:  Skin is warm, dry and intact. No rash noted. Psychiatric: Mood and affect are normal. Speech and behavior are normal. Patient exhibits appropriate insight and  judgment.  ____________________________________________    LABS (pertinent positives/negatives)  Trop hs 3 CBC wbc 8.0, hgb 16.2, plt 226 BMP wnl except glu 156  ____________________________________________   EKG  I, 05/14/20, attending physician, personally viewed and interpreted this EKG  EKG Time: 0909 Rate: 85 Rhythm: normal sinus rhythm Axis: normal Intervals: qtc 440 QRS: narrow ST changes: no st elevation Impression: normal ekg   ____________________________________________    RADIOLOGY  CXR No active cardiopulmonary disease  ____________________________________________   PROCEDURES  Procedures  ____________________________________________   INITIAL IMPRESSION / ASSESSMENT AND PLAN / ED COURSE  Pertinent labs & imaging results that were available during my care of the patient were reviewed by me and considered in my medical decision making (see chart for details).   Patient presented to the emergency department today because of concerns for episode of chest pain that was accompanied by some shortness of breath and  dizziness.  Had a similar episode a few days ago.  At time of my exam patient only had some residual chest discomfort.  Troponin was negative x2.  EKG without ST elevation.  Patient did take one of his own nitroglycerin while here in the emergency department without any significant relief.  He also was given a GI cocktail which she stated did not seem to help lessen the severity of the discomfort.  At this point it does not appear the patient's heart is suffering any damage.  I did however discussed with patient importance of close follow-up with cardiology.  We also discussed return precautions.   ____________________________________________   FINAL CLINICAL IMPRESSION(S) / ED DIAGNOSES  Final diagnoses:  Nonspecific chest pain     Note: This dictation was prepared with Dragon dictation. Any transcriptional errors that result from  this process are unintentional     Phineas Semen, MD 03/14/20 2125

## 2020-03-14 NOTE — ED Notes (Signed)
Pt placed on cardiac monitor in lobby

## 2020-03-14 NOTE — ED Notes (Signed)
Pt resting comfortably at this time. Pt continues to be on cardiac monitor in lobby. Pt is currently in NAD.

## 2020-03-14 NOTE — Discharge Instructions (Signed)
Please seek medical attention for any high fevers, chest pain, shortness of breath, change in behavior, persistent vomiting, bloody stool or any other new or concerning symptoms.  

## 2020-03-14 NOTE — ED Notes (Signed)
Pt states gradual relief of CP since before and continued after receiving PO medications ordered by EDP.

## 2020-03-14 NOTE — ED Triage Notes (Signed)
Pt to ED via POV. Pt states that on Tuesday he had an episode of chest pain with dizziness and shortness of breath. Pt reports that the pain eased off but he has still been having some discomfort. Pt called his cardiologist to move his appt up but he was not able to get in this past week. Pt reports that today he was watching TV and had another episode of lightheadedness, shortness of breath and discomfort in his chest. Pt states that he checked his BP and it was 160/101. Pt has hx/o MI with cardiac arrest last year. Pt is not currently having chest pain.

## 2020-03-14 NOTE — ED Notes (Signed)
Pt noted to be comfortable in stretcher. Pt c/o ccentral CP radiating to R neck.  No other needs expressed at this time.

## 2020-03-17 ENCOUNTER — Other Ambulatory Visit: Payer: BC Managed Care – PPO

## 2020-03-19 ENCOUNTER — Ambulatory Visit: Admit: 2020-03-19 | Payer: BC Managed Care – PPO | Admitting: Internal Medicine

## 2020-03-19 SURGERY — COLONOSCOPY WITH PROPOFOL
Anesthesia: General

## 2020-04-16 ENCOUNTER — Other Ambulatory Visit
Admission: RE | Admit: 2020-04-16 | Discharge: 2020-04-16 | Disposition: A | Discharge status: Home / Self Care | Payer: BC Managed Care – PPO | Source: Ambulatory Visit | Attending: Gastroenterology | Admitting: Gastroenterology

## 2020-04-16 ENCOUNTER — Other Ambulatory Visit: Payer: Self-pay

## 2020-04-16 DIAGNOSIS — Z01812 Encounter for preprocedural laboratory examination: Secondary | ICD-10-CM | POA: Diagnosis not present

## 2020-04-16 DIAGNOSIS — Z20822 Contact with and (suspected) exposure to covid-19: Secondary | ICD-10-CM | POA: Insufficient documentation

## 2020-04-17 LAB — SARS CORONAVIRUS 2 (TAT 6-24 HRS): SARS Coronavirus 2: NEGATIVE

## 2020-04-20 ENCOUNTER — Ambulatory Visit
Admission: RE | Admit: 2020-04-20 | Discharge: 2020-04-20 | Disposition: A | Discharge status: Home / Self Care | Payer: BC Managed Care – PPO | Attending: Gastroenterology | Admitting: Gastroenterology

## 2020-04-20 ENCOUNTER — Ambulatory Visit: Payer: BC Managed Care – PPO | Admitting: Certified Registered Nurse Anesthetist

## 2020-04-20 ENCOUNTER — Encounter: Payer: Self-pay | Admitting: Gastroenterology

## 2020-04-20 ENCOUNTER — Encounter
Admission: RE | Disposition: A | Discharge status: Home / Self Care | Payer: Self-pay | Source: Home / Self Care | Attending: Gastroenterology

## 2020-04-20 DIAGNOSIS — Z1211 Encounter for screening for malignant neoplasm of colon: Secondary | ICD-10-CM | POA: Diagnosis present

## 2020-04-20 DIAGNOSIS — Z6839 Body mass index (BMI) 39.0-39.9, adult: Secondary | ICD-10-CM | POA: Diagnosis not present

## 2020-04-20 DIAGNOSIS — I2510 Atherosclerotic heart disease of native coronary artery without angina pectoris: Secondary | ICD-10-CM | POA: Diagnosis not present

## 2020-04-20 DIAGNOSIS — Z87891 Personal history of nicotine dependence: Secondary | ICD-10-CM | POA: Insufficient documentation

## 2020-04-20 DIAGNOSIS — Z7902 Long term (current) use of antithrombotics/antiplatelets: Secondary | ICD-10-CM | POA: Insufficient documentation

## 2020-04-20 DIAGNOSIS — I10 Essential (primary) hypertension: Secondary | ICD-10-CM | POA: Diagnosis not present

## 2020-04-20 DIAGNOSIS — E669 Obesity, unspecified: Secondary | ICD-10-CM | POA: Insufficient documentation

## 2020-04-20 DIAGNOSIS — K640 First degree hemorrhoids: Secondary | ICD-10-CM | POA: Diagnosis not present

## 2020-04-20 HISTORY — PX: COLONOSCOPY WITH PROPOFOL: SHX5780

## 2020-04-20 SURGERY — COLONOSCOPY WITH PROPOFOL
Anesthesia: General

## 2020-04-20 MED ORDER — LIDOCAINE HCL (CARDIAC) PF 100 MG/5ML IV SOSY: PREFILLED_SYRINGE | INTRAVENOUS | Status: DC | PRN

## 2020-04-20 MED ORDER — PROPOFOL 500 MG/50ML IV EMUL
INTRAVENOUS | Status: DC | PRN
  Administered 2020-04-20: 170 ug/kg/min via INTRAVENOUS

## 2020-04-20 MED ORDER — LIDOCAINE HCL (CARDIAC) PF 100 MG/5ML IV SOSY
PREFILLED_SYRINGE | INTRAVENOUS | Status: DC | PRN
  Administered 2020-04-20: 50 mg via INTRAVENOUS

## 2020-04-20 MED ORDER — SODIUM CHLORIDE 0.9 % IV SOLN
INTRAVENOUS | Status: DC
  Administered 2020-04-20: 20 mL/h via INTRAVENOUS

## 2020-04-20 MED ORDER — PROPOFOL 10 MG/ML IV BOLUS
INTRAVENOUS | Status: DC | PRN
  Administered 2020-04-20: 30 mg via INTRAVENOUS
  Administered 2020-04-20: 70 mg via INTRAVENOUS

## 2020-04-20 NOTE — Transfer of Care (Signed)
Immediate Anesthesia Transfer of Care Note  Patient: Justin Lin  Procedure(s) Performed: COLONOSCOPY WITH PROPOFOL (N/A )  Patient Location: Endoscopy Unit  Anesthesia Type:General  Level of Consciousness: drowsy  Airway & Oxygen Therapy: Patient Spontanous Breathing  Post-op Assessment: Report given to RN and Post -op Vital signs reviewed and stable  Post vital signs: Reviewed and stable  Last Vitals:  Vitals Value Taken Time  BP 120/72 04/20/20 1159  Temp    Pulse 76 04/20/20 1200  Resp 19 04/20/20 1200  SpO2 94 % 04/20/20 1200  Vitals shown include unvalidated device data.  Last Pain:  Vitals:   04/20/20 1104  TempSrc: Temporal  PainSc: 3          Complications: No complications documented.

## 2020-04-20 NOTE — Op Note (Signed)
[facility] Gastroenterology Patient Name: Justin Lin Procedure Date: 04/20/2020 11:24 AM MRN: [id number] Account #: [id number] Date of Birth: 1960/11/04 Admit Type: Outpatient Age: 59 Room: [facility] ENDO ROOM 3 Gender: Male Note Status: Finalized Procedure:             Colonoscopy Indications:           Screening for colorectal malignant neoplasm Providers:             Andrey Farmer MD, MD Medicines:             Monitored Anesthesia Care Complications:         No immediate complications. Procedure:             Pre-Anesthesia Assessment:                        - Prior to the procedure, a History and Physical was                         performed, and patient medications and allergies were                         reviewed. The patient is competent. The risks and                         benefits of the procedure and the sedation options and                         risks were discussed with the patient. All questions                         were answered and informed consent was obtained.                         Patient identification and proposed procedure were                         verified by the physician, the nurse, the anesthetist                         and the technician in the endoscopy suite. Mental                         Status Examination: alert and oriented. Airway                         Examination: normal oropharyngeal airway and neck                         mobility. Respiratory Examination: clear to                         auscultation. CV Examination: normal. Prophylactic                         Antibiotics: The patient does not require prophylactic                         antibiotics. Prior Anticoagulants: The patient has  taken Plavix (clopidogrel), last dose was 5 days prior                         to procedure. ASA Grade Assessment: III - A patient                         with severe systemic disease. After reviewing  the                         risks and benefits, the patient was deemed in                         satisfactory condition to undergo the procedure. The                         anesthesia plan was to use monitored anesthesia care                         (MAC). Immediately prior to administration of                         medications, the patient was re-assessed for adequacy                         to receive sedatives. The heart rate, respiratory                         rate, oxygen saturations, blood pressure, adequacy of                         pulmonary ventilation, and response to care were                         monitored throughout the procedure. The physical                         status of the patient was re-assessed after the                         procedure.                        After obtaining informed consent, the colonoscope was                         passed under direct vision. Throughout the procedure,                         the patient's blood pressure, pulse, and oxygen                         saturations were monitored continuously. The                         Colonoscope was introduced through the anus and                         advanced to the the cecum, identified by appendiceal  orifice and ileocecal valve. The colonoscopy was                         performed without difficulty. The patient tolerated                         the procedure well. The quality of the bowel                         preparation was adequate to identify polyps. Findings:      The perianal and digital rectal examinations were normal.      Non-bleeding internal hemorrhoids were found during retroflexion. The       hemorrhoids were Grade I (internal hemorrhoids that do not prolapse).      The exam was otherwise without abnormality on direct and retroflexion       views. Impression:            - Non-bleeding internal hemorrhoids.                        - The  examination was otherwise normal on direct and                         retroflexion views.                        - No specimens collected. Recommendation:        - Discharge patient to home.                        - Resume previous diet.                        - Resume Plavix (clopidogrel) at prior dose today.                        - Repeat colonoscopy in 10 years for screening                         purposes.                        - Return to referring physician as previously                         scheduled. Procedure Code(s):     --- Professional ---                        J0932, Colorectal cancer screening; colonoscopy on                         individual not meeting criteria for high risk Diagnosis Code(s):     --- Professional ---                        Z12.11, Encounter for screening for malignant neoplasm                         of colon  K64.0, First degree hemorrhoids CPT copyright 2019 [employer]. All rights reserved. The codes documented in this report are preliminary and upon coder review may  be revised to meet current compliance requirements. Andrey Farmer, MD Andrey Farmer MD, MD 04/20/2020 11:59:40 AM Number of Addenda: 0 Note Initiated On: 04/20/2020 11:24 AM Scope Withdrawal Time: 0 hours 9 minutes 59 seconds  Total Procedure Duration: 0 hours 14 minutes 52 seconds  Estimated Blood Loss:  Estimated blood loss: none.      [facility]

## 2020-04-20 NOTE — Anesthesia Preprocedure Evaluation (Addendum)
Anesthesia Evaluation  Patient identified by MRN, date of birth, ID band Patient awake    Reviewed: Allergy & Precautions, H&P , NPO status , Patient's Chart, lab work & pertinent test results  History of Anesthesia Complications Negative for: history of anesthetic complications  Airway Mallampati: III  TM Distance: >3 FB     Dental   Pulmonary neg pulmonary ROS, neg sleep apnea, neg COPD, former smoker,    breath sounds clear to auscultation       Cardiovascular hypertension, (-) angina+ CAD, + Past MI and + Cardiac Stents  (-) dysrhythmias  Rhythm:regular Rate:Normal     Neuro/Psych negative neurological ROS  negative psych ROS   GI/Hepatic negative GI ROS, Neg liver ROS,   Endo/Other  negative endocrine ROS  Renal/GU negative Renal ROS  negative genitourinary   Musculoskeletal   Abdominal   Peds  Hematology negative hematology ROS (+)   Anesthesia Other Findings Obese  Past Medical History: No date: Coronary artery disease No date: Hypertension No date: Kidney stone  Past Surgical History: 03/21/2019: CORONARY/GRAFT ACUTE MI REVASCULARIZATION; N/A     Comment:  Procedure: Coronary/Graft Acute MI Revascularization;                Surgeon: Marcina Millard, MD;  Location: [facility]               INVASIVE CV LAB;  Service: Cardiovascular;  Laterality:               N/A; 03/21/2019: LEFT HEART CATH AND CORONARY ANGIOGRAPHY; N/A     Comment:  Procedure: LEFT HEART CATH AND CORONARY ANGIOGRAPHY;                Surgeon: Marcina Millard, MD;  Location: [facility]               INVASIVE CV LAB;  Service: Cardiovascular;  Laterality:               N/A;  BMI    Body Mass Index: 39.16 kg/m      Reproductive/Obstetrics negative OB ROS                          Anesthesia Physical Anesthesia Plan  ASA: II  Anesthesia Plan: General   Post-op Pain Management:    Induction:   PONV Risk  Score and Plan: Propofol infusion and TIVA  Airway Management Planned: Nasal Cannula  Additional Equipment:   Intra-op Plan:   Post-operative Plan:   Informed Consent: I have reviewed the patients History and Physical, chart, labs and discussed the procedure including the risks, benefits and alternatives for the proposed anesthesia with the patient or authorized representative who has indicated his/her understanding and acceptance.     Dental Advisory Given  Plan Discussed with: Anesthesiologist, CRNA and Surgeon  Anesthesia Plan Comments:         Anesthesia Quick Evaluation

## 2020-04-20 NOTE — Interval H&P Note (Signed)
History and Physical Interval Note:  04/20/2020 11:29 AM  Justin Lin  has presented today for surgery, with the diagnosis of LLQ PAIN.  The various methods of treatment have been discussed with the patient and family. After consideration of risks, benefits and other options for treatment, the patient has consented to  Procedure(s): COLONOSCOPY WITH PROPOFOL (N/A) as a surgical intervention.  The patient's history has been reviewed, patient examined, no change in status, stable for surgery.  I have reviewed the patient's chart and labs.  Questions were answered to the patient's satisfaction.     Regis Bill  Ok to proceed with colonoscopy

## 2020-04-20 NOTE — Anesthesia Postprocedure Evaluation (Signed)
Anesthesia Post Note  Patient: Justin Lin  Procedure(s) Performed: COLONOSCOPY WITH PROPOFOL (N/A )  Patient location during evaluation: PACU Anesthesia Type: General Level of consciousness: awake and alert Pain management: pain level controlled Vital Signs Assessment: post-procedure vital signs reviewed and stable Respiratory status: spontaneous breathing, nonlabored ventilation and respiratory function stable Cardiovascular status: blood pressure returned to baseline and stable Postop Assessment: no apparent nausea or vomiting Anesthetic complications: no   No complications documented.   Last Vitals:  Vitals:   04/20/20 1210 04/20/20 1220  BP: 120/80 125/89  Pulse:    Resp:    Temp:    SpO2:      Last Pain:  Vitals:   04/20/20 1220  TempSrc:   PainSc: 0-No pain                 [name]

## 2020-04-20 NOTE — H&P (Signed)
Outpatient short stay form Pre-procedure 04/20/2020 11:27 AM Justin Lot MD, MPH  Primary Physician: NP Etheleen Mayhew  Reason for visit:  Screening  History of present illness:   59 y/o gentleman with history of CAD here for screening colonoscopy. Takes plavix with last dose 5 days ago. No abdominal surgeries. No family history of GI malignancies. No new GI symptoms.    Current Facility-Administered Medications:  .  0.9 %  sodium chloride infusion, , Intravenous, Continuous, Japneet Staggs, Rossie Muskrat, MD, Last Rate: 20 mL/hr at 04/20/20 1116, 20 mL/hr at 04/20/20 1116  Medications Prior to Admission  Medication Sig Dispense Refill Last Dose  . acetaminophen (TYLENOL) 325 MG tablet Take 2 tablets (650 mg total) by mouth every 4 (four) hours as needed for headache or mild pain.   04/19/2020 at Unknown time  . amiodarone (PACERONE) 200 MG tablet Take 2 tablets (400 mg total) by mouth 2 (two) times daily. Take 400 mg twice a day for 1 week followed by 200 mg twice a day 90 tablet 0 04/19/2020 at Unknown time  . atorvastatin (LIPITOR) 80 MG tablet Take 1 tablet (80 mg total) by mouth daily at 6 PM. 30 tablet 0 04/19/2020 at Unknown time  . clopidogrel (PLAVIX) 75 MG tablet Take 75 mg by mouth daily.   Past Week at Unknown time  . famotidine (PEPCID) 20 MG tablet Take 1 tablet (20 mg total) by mouth daily. 30 tablet 1 04/19/2020 at Unknown time  . lansoprazole (PREVACID) 15 MG capsule Take 15 mg by mouth daily at 12 noon.   04/19/2020 at Unknown time  . lisinopril (ZESTRIL) 2.5 MG tablet Take 1 tablet (2.5 mg total) by mouth daily. 30 tablet 0 04/19/2020 at Unknown time  . loratadine (CLARITIN) 10 MG tablet Take 10 mg by mouth daily as needed for allergies.   04/19/2020 at Unknown time  . losartan (COZAAR) 25 MG tablet Take 25 mg by mouth daily.   04/19/2020 at Unknown time  . metoprolol succinate (TOPROL-XL) 50 MG 24 hr tablet Take 1 tablet (50 mg total) by mouth daily. Take with or immediately following  a meal. 30 tablet 0 04/20/2020 at 0800  . nitroGLYCERIN (NITROSTAT) 0.4 MG SL tablet Place 1 tablet (0.4 mg total) under the tongue every 5 (five) minutes x 3 doses as needed for chest pain. 10 tablet 0 04/19/2020 at Unknown time  . tamsulosin (FLOMAX) 0.4 MG CAPS capsule Take 0.4 mg by mouth.   04/19/2020 at Unknown time  . traZODone (DESYREL) 50 MG tablet Take 50 mg by mouth at bedtime.   04/19/2020 at Unknown time  . ticagrelor (BRILINTA) 90 MG TABS tablet Take 1 tablet (90 mg total) by mouth 2 (two) times daily. 60 tablet 0      No Known Allergies   Past Medical History:  Diagnosis Date  . Coronary artery disease   . Hypertension   . Kidney stone     Review of systems:  Otherwise negative.    Physical Exam  Gen: Alert, oriented. Appears stated age.  HEENT: PERRLA. Lungs: No respiratory distress CV: RRR Abd: soft, benign, no masses.  Ext: No edema.     Planned procedures: Proceed with colonoscopy. The patient understands the nature of the planned procedure, indications, risks, alternatives and potential complications including but not limited to bleeding, infection, perforation, damage to internal organs and possible oversedation/side effects from anesthesia. The patient agrees and gives consent to proceed.  Please refer to procedure notes for findings, recommendations and patient  disposition/instructions.     Justin Lot MD, MPH Gastroenterology 04/20/2020  11:27 AM

## 2020-08-07 IMAGING — CT CT ABD-PELV W/ CM
2 of 5 series · 16 of 46 positions shown, 18 images · IV contrast (omnipaque)
Comparison: July 03, 2019.

CLINICAL DATA: Acute left lower quadrant abdominal pain.

EXAM:
CT ABDOMEN AND PELVIS WITH CONTRAST
TECHNIQUE: Multidetector CT imaging of the abdomen and pelvis was performed
using the standard protocol following bolus administration of
intravenous contrast.
CONTRAST:  125mL OMNIPAQUE IOHEXOL 300 MG/ML  SOLN

[Series 2: routine abd/pel with · axial · 0.89mm/px · z∈[-1128,-648]mm · 13 of 108 slices shown, 15 images]
[im 6/108  soft-tissue]
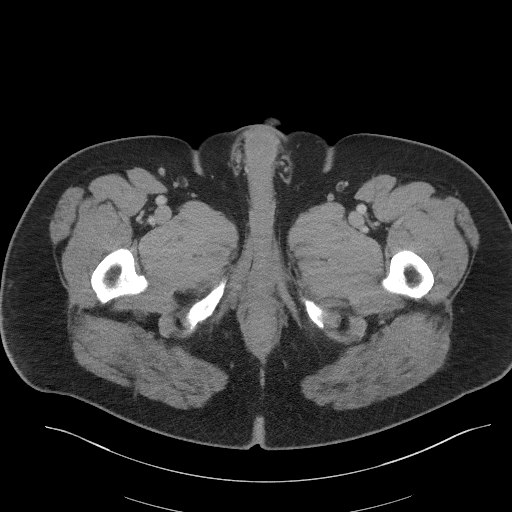
[im 6/108  bone]
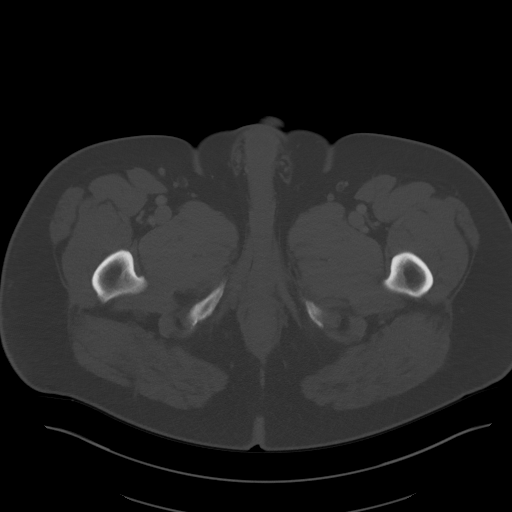
[im 12/108  soft-tissue]
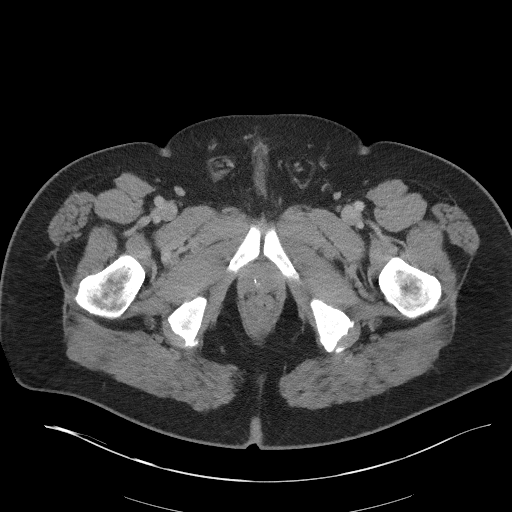
[im 24/108  soft-tissue]
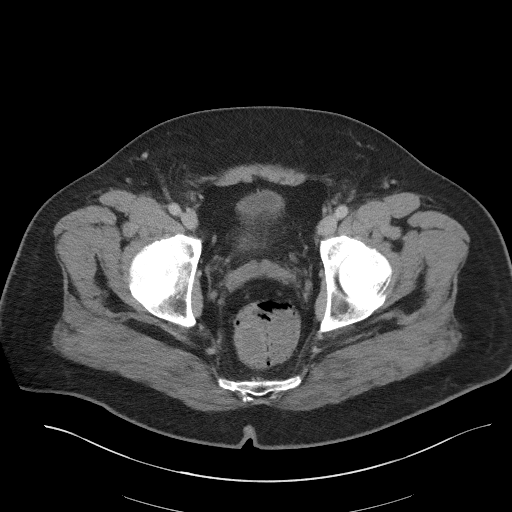
[im 30/108  soft-tissue]
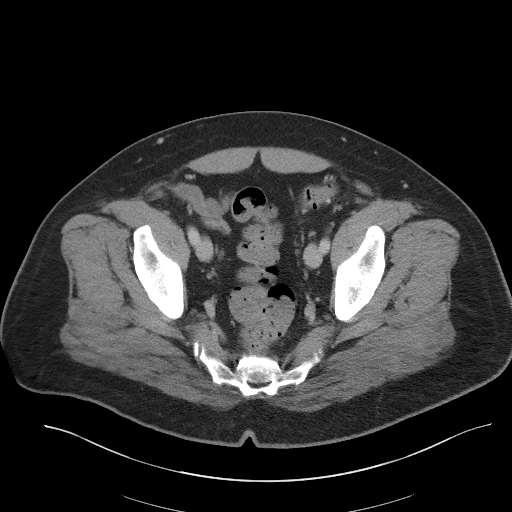
[im 36/108  soft-tissue]
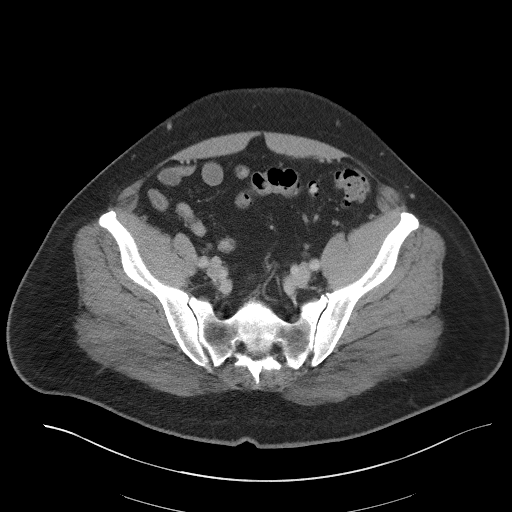
[im 48/108  soft-tissue]
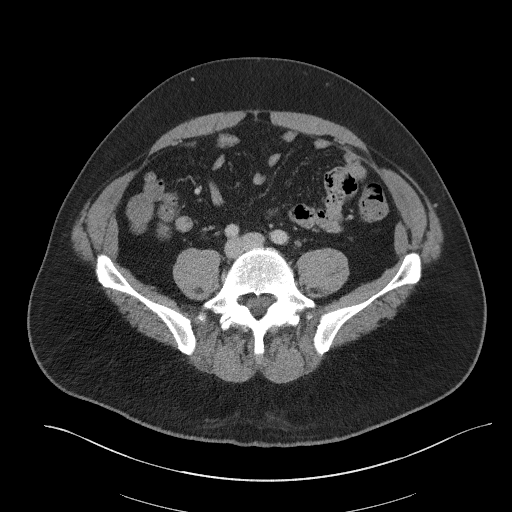
[im 54/108  soft-tissue]
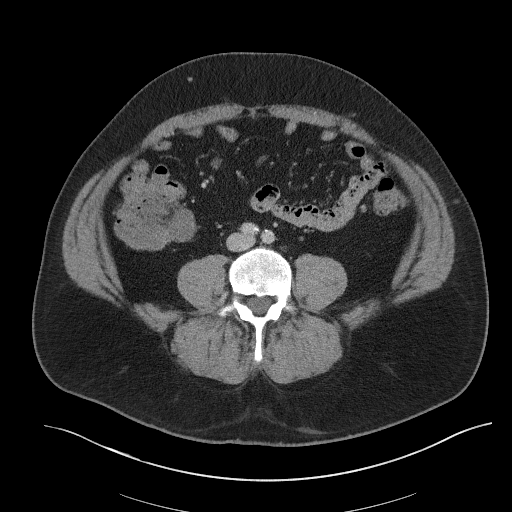
[im 60/108  soft-tissue]
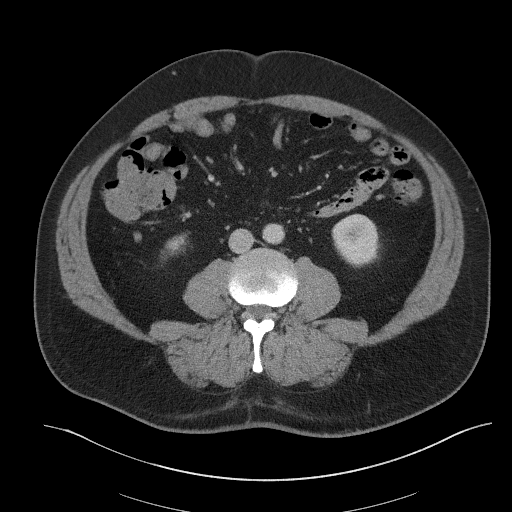
[im 72/108  soft-tissue]
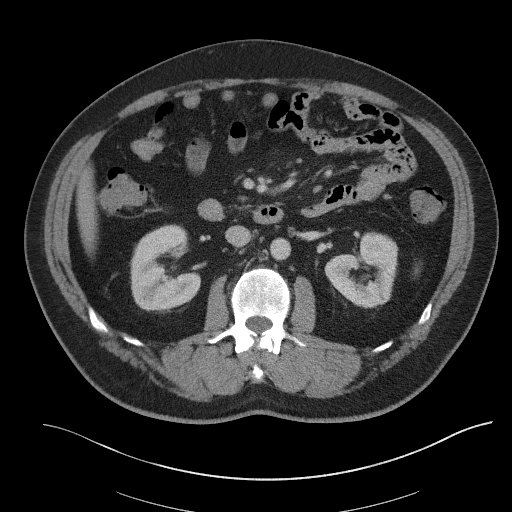
[im 72/108  bone]
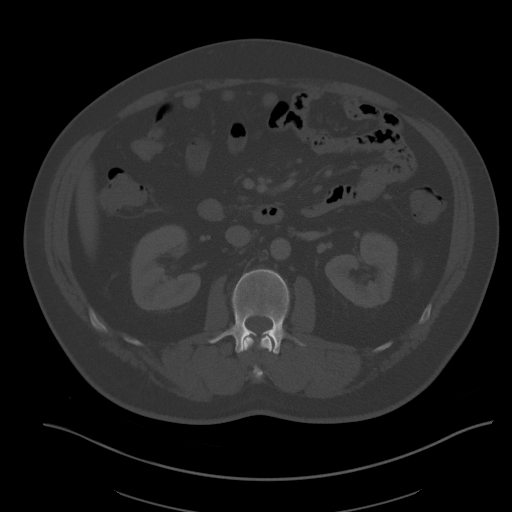
[im 78/108  soft-tissue]
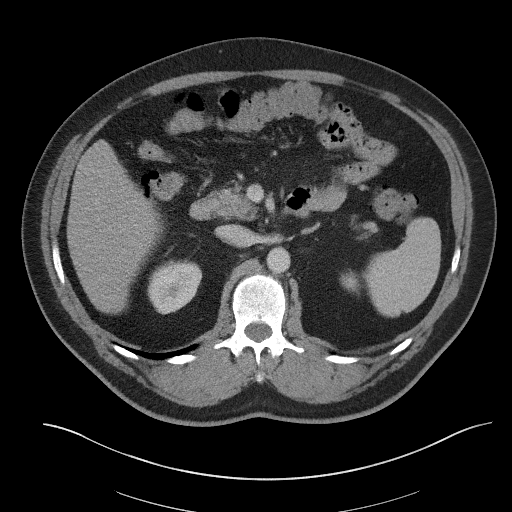
[im 84/108  soft-tissue]
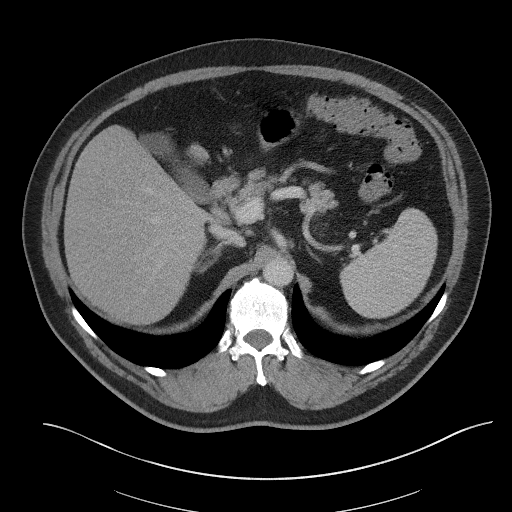
[im 96/108  soft-tissue]
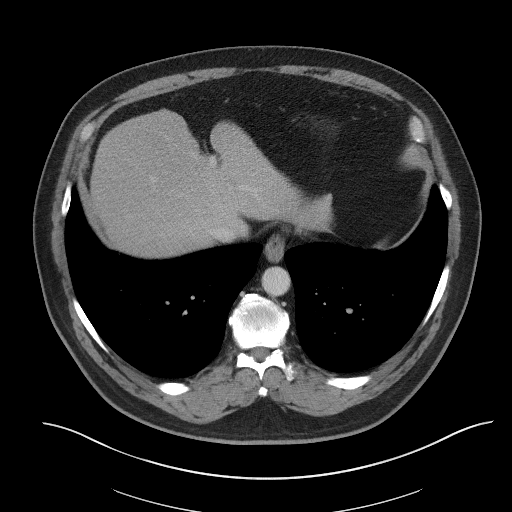
[im 102/108  soft-tissue]
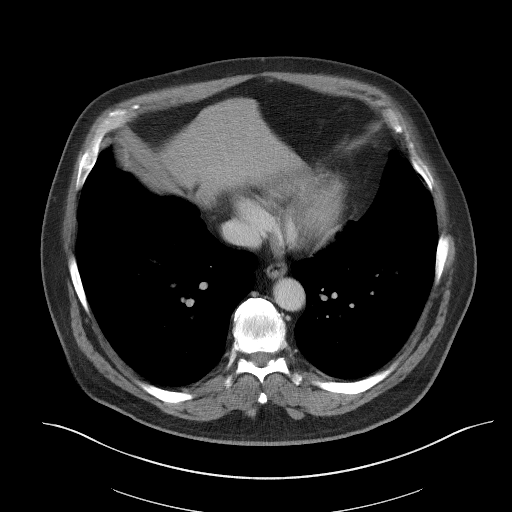

[Series 5: coronal st · coronal · 0.93mm/px · 3 of 124 slices shown]
[im 42/124  soft-tissue]
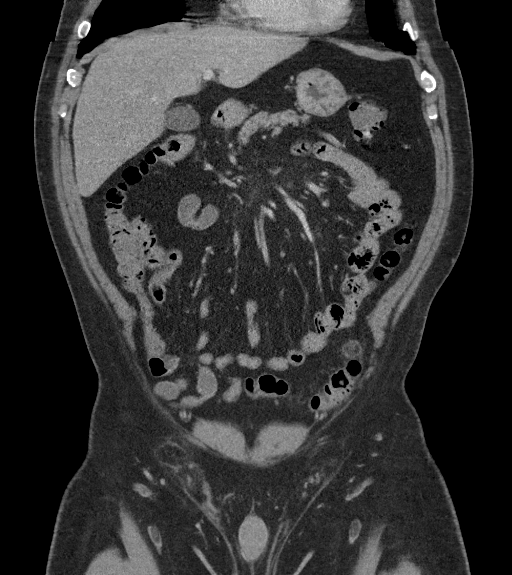
[im 55/124  soft-tissue]
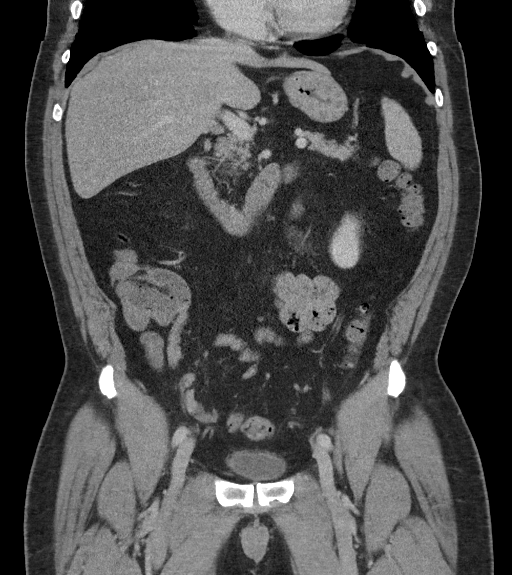
[im 69/124  soft-tissue]
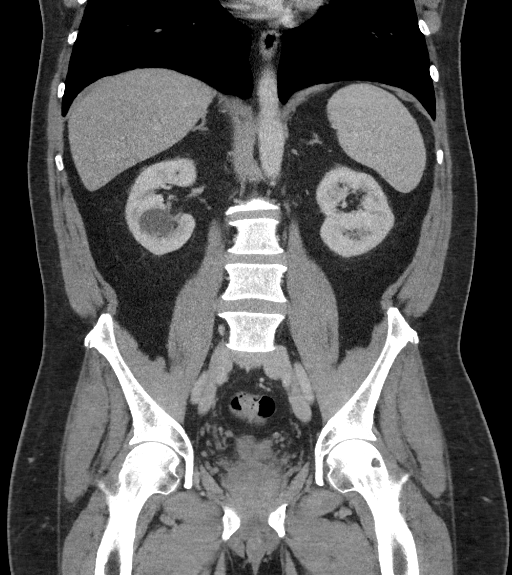

[16 of 46 positions shown; findings below may reference images not displayed]

FINDINGS: Lower chest: No acute abnormality.

Hepatobiliary: No gallstones or biliary dilatation is noted. Stable
left hepatic cyst is noted.

Pancreas: Unremarkable. No pancreatic ductal dilatation or
surrounding inflammatory changes.

Spleen: Normal in size without focal abnormality.

Adrenals/Urinary Tract: Adrenal glands appear normal. Minimal
bilateral nonobstructive nephrolithiasis is noted. Stable right
renal cysts are noted. No hydronephrosis or renal obstruction is
noted. Urinary bladder is unremarkable.

Stomach/Bowel: Stomach is within normal limits. Appendix appears
normal. There is no evidence of bowel obstruction. Sigmoid
diverticulosis is noted. There is again noted findings consistent
with epiploic appendagitis seen in the left lower quadrant arising
near the junction of the descending and sigmoid colon.

Vascular/Lymphatic: No significant vascular findings are present. No
enlarged abdominal or pelvic lymph nodes.

Reproductive: Prostate is unremarkable.

Other: No abdominal wall hernia or abnormality. No abdominopelvic
ascites.

Musculoskeletal: No acute or significant osseous findings.
IMPRESSION: 1. Stable findings consistent with epiploic appendagitis seen in the
left lower quadrant near the junction of the descending and sigmoid
colon.
2. Sigmoid diverticulosis is noted without inflammation.
3. Minimal bilateral nonobstructive nephrolithiasis. No
hydronephrosis or renal obstruction is noted.

## 2021-04-10 IMAGING — CR DG CHEST 2V
1 series · 4 of 4 positions shown · non-contrast
Comparison: Chest radiograph dated 03/21/2019

CLINICAL DATA: 59-year-old male with chest pain.

EXAM:
CHEST - 2 VIEW

[Series 1: dg chest 2 view · 0.14mm/px · 4 of 4 slices shown]
[im 1/4]
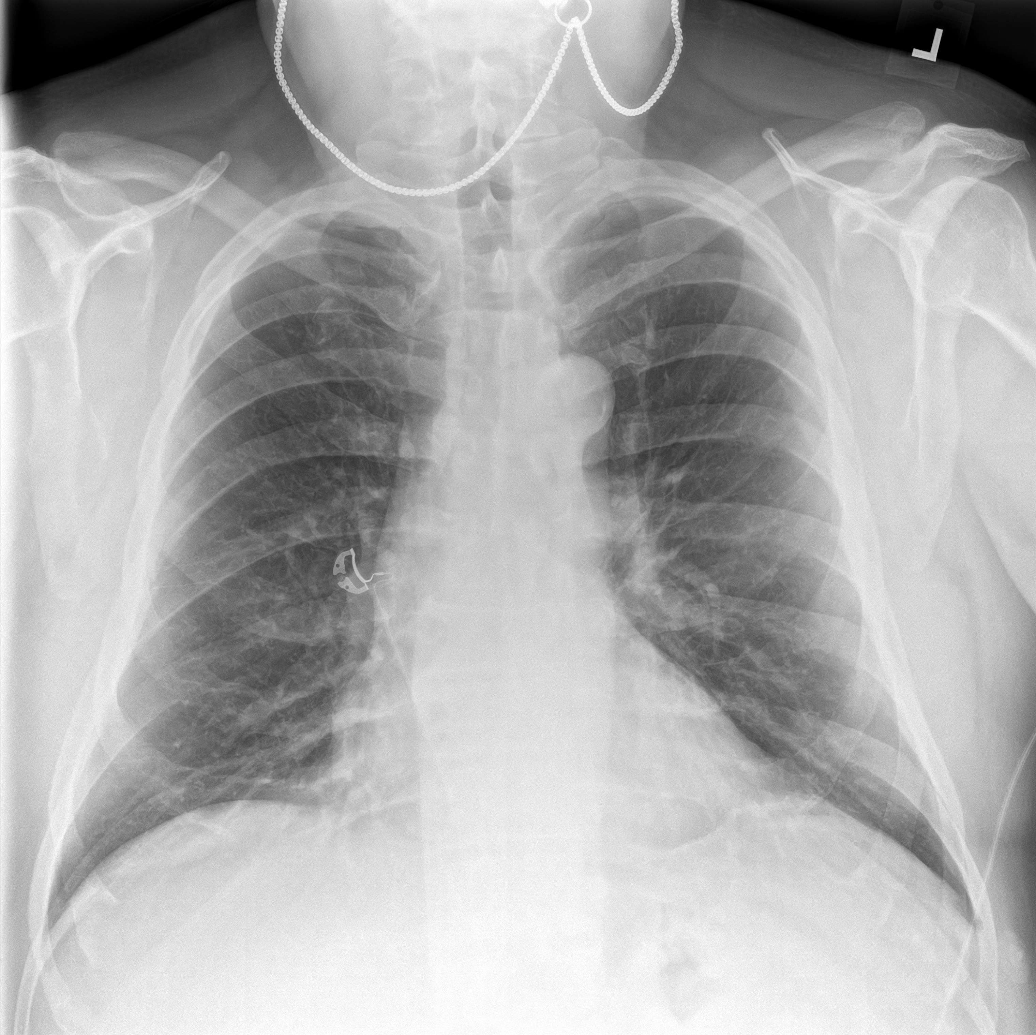
[im 2/4]
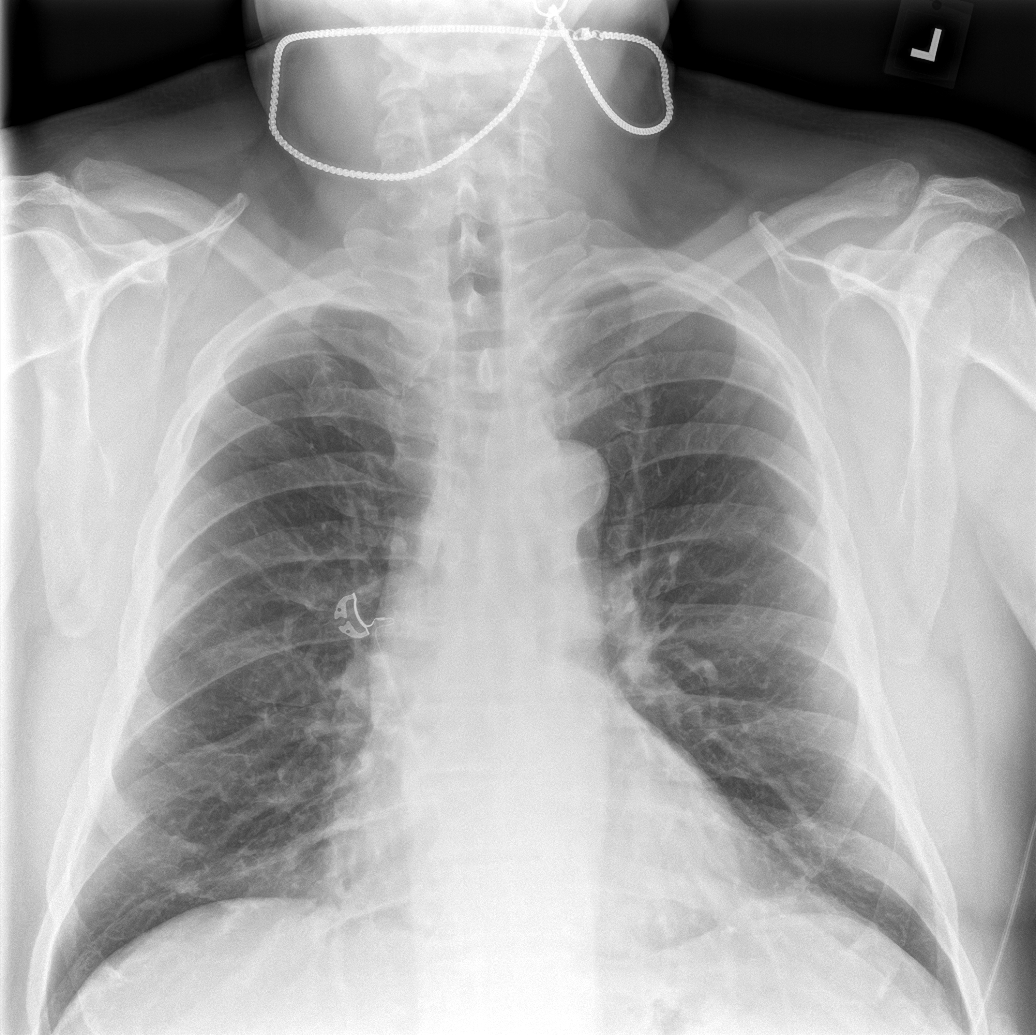
[im 3/4]
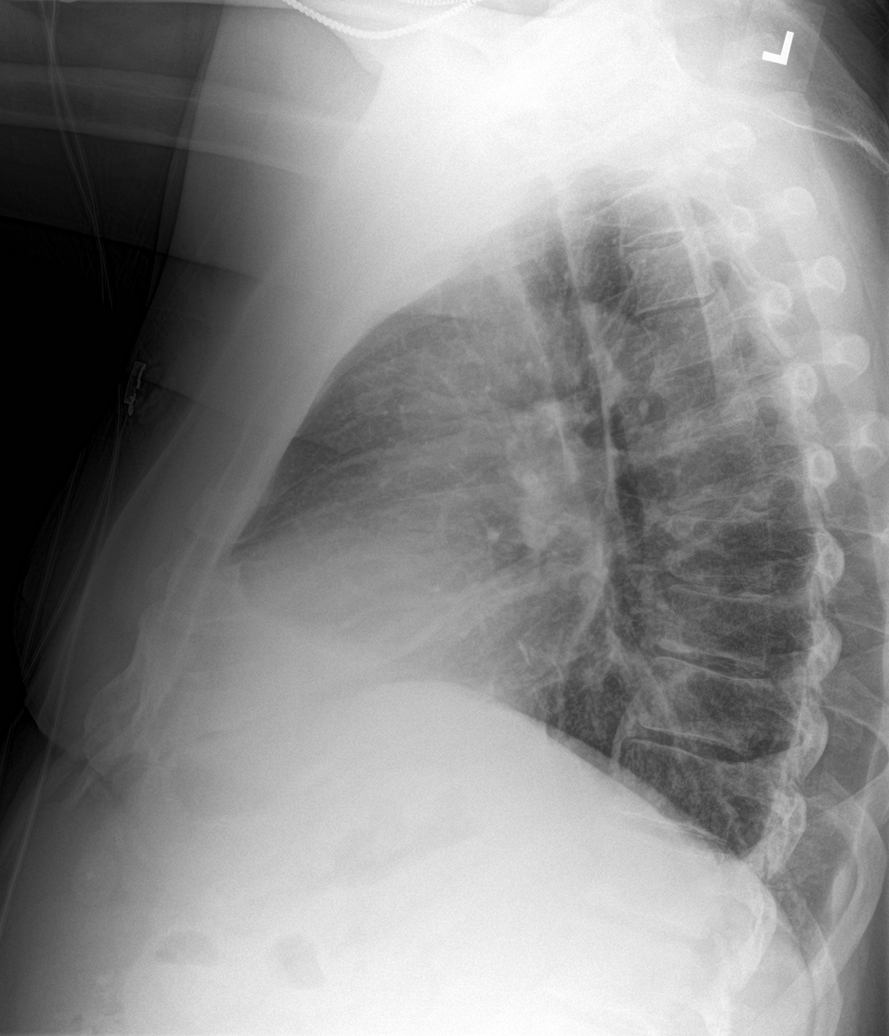
[im 4/4]
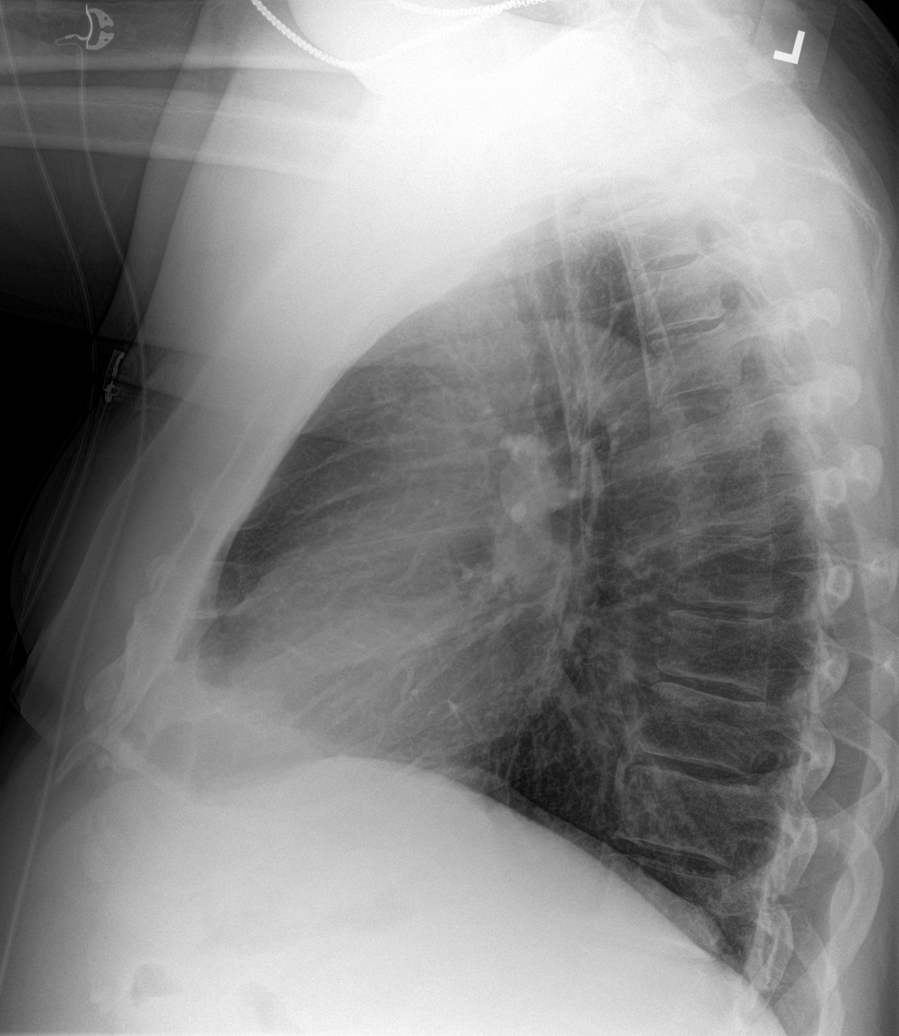

[4 of 4 positions shown; findings below may reference images not displayed]

FINDINGS: The lungs are clear. There is no pleural effusion or pneumothorax.
The cardiac silhouette is within limits. Atherosclerotic
calcification of the aorta. No acute osseous pathology.
IMPRESSION: No active cardiopulmonary disease.

## 2023-10-31 ENCOUNTER — Other Ambulatory Visit: Payer: Self-pay | Admitting: Physician Assistant

## 2023-10-31 DIAGNOSIS — I700 Atherosclerosis of aorta: Secondary | ICD-10-CM

## 2023-10-31 DIAGNOSIS — I10 Essential (primary) hypertension: Secondary | ICD-10-CM

## 2023-10-31 DIAGNOSIS — Z8679 Personal history of other diseases of the circulatory system: Secondary | ICD-10-CM

## 2023-10-31 DIAGNOSIS — I252 Old myocardial infarction: Secondary | ICD-10-CM

## 2023-10-31 DIAGNOSIS — R0609 Other forms of dyspnea: Secondary | ICD-10-CM

## 2023-11-20 ENCOUNTER — Ambulatory Visit
Admission: RE | Admit: 2023-11-20 | Discharge: 2023-11-20 | Disposition: A | Discharge status: Home / Self Care | Source: Ambulatory Visit | Attending: Physician Assistant | Admitting: Physician Assistant

## 2023-11-20 ENCOUNTER — Ambulatory Visit
Admission: RE | Admit: 2023-11-20 | Discharge: 2023-11-20 | Disposition: A | Discharge status: Home / Self Care | Source: Ambulatory Visit

## 2023-11-20 DIAGNOSIS — Z8679 Personal history of other diseases of the circulatory system: Secondary | ICD-10-CM | POA: Diagnosis present

## 2023-11-20 DIAGNOSIS — I252 Old myocardial infarction: Secondary | ICD-10-CM | POA: Insufficient documentation

## 2023-11-20 DIAGNOSIS — I700 Atherosclerosis of aorta: Secondary | ICD-10-CM | POA: Diagnosis present

## 2023-11-20 DIAGNOSIS — R0609 Other forms of dyspnea: Secondary | ICD-10-CM | POA: Insufficient documentation

## 2023-11-20 MED ORDER — REGADENOSON 0.4 MG/5ML IV SOLN
0.4000 mg | Freq: Once | INTRAVENOUS | Status: AC
  Administered 2023-11-20: 0.4 mg via INTRAVENOUS
  Filled 2023-11-20: qty 5

## 2023-11-20 MED ORDER — TECHNETIUM TC 99M TETROFOSMIN IV KIT
11.0600 | PACK | Freq: Once | INTRAVENOUS | Status: AC | PRN
  Administered 2023-11-20: 11.06 via INTRAVENOUS

## 2023-11-20 MED ORDER — TECHNETIUM TC 99M TETROFOSMIN IV KIT
29.8100 | PACK | Freq: Once | INTRAVENOUS | Status: AC | PRN
  Administered 2023-11-20: 29.81 via INTRAVENOUS

## 2023-11-21 ENCOUNTER — Ambulatory Visit

## 2023-11-22 LAB — NM MYOCAR MULTI W/SPECT W/WALL MOTION / EF
Estimated workload: 1
Exercise duration (min): 1 min
Exercise duration (sec): 0 s
LV dias vol: 122 mL (ref 62–150)
LV sys vol: 47 mL (ref 4.2–5.8)
MPHR: 156 {beats}/min
Nuc Stress EF: 61 %
Peak HR: 84 {beats}/min
Percent HR: 53 %
Rest HR: 65 {beats}/min
Rest Nuclear Isotope Dose: 11.1 mCi
SDS: 0
SRS: 7
SSS: 1
ST Depression (mm): 0 mm
Stress Nuclear Isotope Dose: 29.8 mCi
TID: 1.07

## 2024-06-07 ENCOUNTER — Other Ambulatory Visit: Payer: Self-pay | Admitting: Gerontology

## 2024-06-07 DIAGNOSIS — R1032 Left lower quadrant pain: Secondary | ICD-10-CM

## 2024-06-07 DIAGNOSIS — Z1331 Encounter for screening for depression: Secondary | ICD-10-CM
